# Patient Record
Sex: Female | Born: 1980 | Race: Black or African American | Hispanic: No | Marital: Married | Smoking: Never smoker
Health system: Southern US, Community
[De-identification: ages and names within clinical notes are randomized; demographics above are authoritative.]

## PROBLEM LIST (undated history)

## (undated) DIAGNOSIS — Z8619 Personal history of other infectious and parasitic diseases: Secondary | ICD-10-CM

## (undated) DIAGNOSIS — R519 Headache, unspecified: Secondary | ICD-10-CM

## (undated) HISTORY — DX: Personal history of other infectious and parasitic diseases: Z86.19

## (undated) HISTORY — DX: Headache, unspecified: R51.9

## (undated) NOTE — Assessment & Plan Note (Signed)
Associated Problem(s): Vitamin D deficiency  Formatting of this note might be different from the original.   Due for re-eval after  prescription supplement.. now on OTC.  Electronically signed by Excell Seltzer, MD at 08/10/2022  1:03 PM EDT

## (undated) NOTE — Assessment & Plan Note (Signed)
Associated Problem(s): Chronic tension headaches  Formatting of this note might be different from the original.   Chronic stable control.   Associated with menses.  Electronically signed by Excell Seltzer, MD at 08/10/2022 12:59 PM EDT

## (undated) NOTE — Assessment & Plan Note (Signed)
Associated Problem(s): Dandruff  Formatting of this note might be different from the original.  Stable, chronic.  Continue current medication.     Ketoconazole shampoo.  Electronically signed by Excell Seltzer, MD at 08/10/2022  1:04 PM EDT

## (undated) NOTE — Assessment & Plan Note (Signed)
Associated Problem(s): Chronic constipation  Formatting of this note might be different from the original.  Stable, chronic.  Continue current medication.     Using miralax 2 times daily.  Moderate water and fiber use.  Electronically signed by Excell Seltzer, MD at 08/10/2022  1:01 PM EDT

## (undated) NOTE — Assessment & Plan Note (Signed)
Associated Problem(s): Acne  Formatting of this note might be different from the original.   Chronic, well controlled on clindamycin Benzyl peroxide gel daily.   Interested in retrying  retinol cream.. will let me know which one she used in past.  Electronically signed by Excell Seltzer, MD at 08/10/2022 12:58 PM EDT

## (undated) NOTE — Progress Notes (Signed)
Formatting of this note is different from the original.  Images from the original note were not included.       Patient ID: Sara Dickson, female    DOB: 11-14-1980, 40 y.o.   MRN: [id number]    This visit was conducted in person.    BP 110/64   Pulse 65   Temp (!) 97.4 F (36.3 C) (Temporal)   Ht 5' 5.25" (1.657 m)   Wt 129 lb (58.5 kg)   SpO2 99%   BMI 21.30 kg/m      CC:   Chief Complaint   Patient presents with    Transitions Of Care     From Dr. Selena Batten      Subjective:     HPI:  Sara Dickson is a 75 y.o. female presenting on 08/10/2022 for Transitions Of Care (From Dr. Selena Batten )    Previous PCP: Selena Batten  Last physical: 05/07/21  GYN: 06/18/2022 Dr. Mindi Slicker    The patient presents for  complete physical and review of chronic health problems. He/She also has the following acute concerns today: none       Chronic tension headaches: associated with menses. Using tylnol or ibuprofen.   Not associated with light and sound sensitivity.     Chronic constipation: Using miralax 2 times daily    Relevant past medical, surgical, family and social history reviewed and updated as indicated. Interim medical history since our last visit reviewed.  Allergies and medications reviewed and updated.  Outpatient Medications Prior to Visit   Medication Sig Dispense Refill    acetaminophen (TYLENOL) 325 MG tablet Take 650 mg by mouth every 6 (six) hours as needed.      Cholecalciferol 1.25 MG (50000 UT) TABS Take 1 tablet by mouth once a week. 4 tablet 1    Clindamycin-Benzoyl Per, Refr, gel APPLY TOPICALLY TO AFFECTED AREA EVERY DAY 45 g 0    ibuprofen (ADVIL) 200 MG tablet Take 200 mg by mouth every 6 (six) hours as needed.      ketoconazole (NIZORAL) 2 % shampoo APPLY TO AFFECTED AREA TWICE A WEEK 120 mL 0    levonorgestrel (MIRENA, 52 MG,) 20 MCG/24HR IUD Mirena 20 mcg/24 hours (6 yrs) 52 mg intrauterine device   Take 1 device by intrauterine route.      polyethylene glycol powder (GLYCOLAX/MIRALAX) 17 GM/SCOOP powder Take 1 Container  by mouth as needed.       No facility-administered medications prior to visit.       Per HPI unless specifically indicated in ROS section below  Review of Systems   Constitutional:  Negative for fatigue and fever.   HENT:  Negative for congestion.    Eyes:  Negative for pain.   Respiratory:  Negative for cough and shortness of breath.    Cardiovascular:  Negative for chest pain, palpitations and leg swelling.   Gastrointestinal:  Negative for abdominal pain.   Genitourinary:  Negative for dysuria and vaginal bleeding.   Musculoskeletal:  Negative for back pain.   Neurological:  Negative for syncope, light-headedness and headaches.   Psychiatric/Behavioral:  Negative for dysphoric mood.      Objective:   BP 110/64   Pulse 65   Temp (!) 97.4 F (36.3 C) (Temporal)   Ht 5' 5.25" (1.657 m)   Wt 129 lb (58.5 kg)   SpO2 99%   BMI 21.30 kg/m    Wt Readings from Last 3 Encounters:   08/10/22 129 lb (58.5 kg)  05/07/21 129 lb 1 oz (58.5 kg)   02/20/20 127 lb 12 oz (57.9 kg)       Physical Exam  Constitutional:       General: She is not in acute distress.     Appearance: Normal appearance. She is well-developed. She is not ill-appearing or toxic-appearing.   HENT:      Head: Normocephalic.      Right Ear: Hearing, tympanic membrane, ear canal and external ear normal. Tympanic membrane is not erythematous, retracted or bulging.      Left Ear: Hearing, tympanic membrane, ear canal and external ear normal. Tympanic membrane is not erythematous, retracted or bulging.      Nose: No mucosal edema or rhinorrhea.      Right Sinus: No maxillary sinus tenderness or frontal sinus tenderness.      Left Sinus: No maxillary sinus tenderness or frontal sinus tenderness.      Mouth/Throat:      Pharynx: Uvula midline.   Eyes:      General: Lids are normal. Lids are everted, no foreign bodies appreciated.      Conjunctiva/sclera: Conjunctivae normal.      Pupils: Pupils are equal, round, and reactive to light.   Neck:      Thyroid:  No thyroid mass or thyromegaly.      Vascular: No carotid bruit.      Trachea: Trachea normal.   Cardiovascular:      Rate and Rhythm: Normal rate and regular rhythm.      Pulses: Normal pulses.      Heart sounds: Normal heart sounds, S1 normal and S2 normal. No murmur heard.     No friction rub. No gallop.   Pulmonary:      Effort: Pulmonary effort is normal. No tachypnea or respiratory distress.      Breath sounds: Normal breath sounds. No decreased breath sounds, wheezing, rhonchi or rales.   Abdominal:      General: Bowel sounds are normal.      Palpations: Abdomen is soft.      Tenderness: There is no abdominal tenderness.   Musculoskeletal:      Cervical back: Normal range of motion and neck supple.   Skin:     General: Skin is warm and dry.      Findings: No rash.   Neurological:      Mental Status: She is alert.   Psychiatric:         Mood and Affect: Mood is not anxious or depressed.         Speech: Speech normal.         Behavior: Behavior normal. Behavior is cooperative.         Thought Content: Thought content normal.         Judgment: Judgment normal.       Results for orders placed or performed in visit on 10/30/21   HM PAP SMEAR   Result Value Ref Range    HM Pap smear negative    Results Console HPV   Result Value Ref Range    CHL HPV Negative      Assessment and Plan  The patient's preventative maintenance and recommended screening tests for an annual wellness exam were reviewed in full today.  Brought up to date unless services declined.    Counselled on the importance of diet, exercise, and its role in overall health and mortality.  The patient's FH and SH was reviewed, including their home life, tobacco status,  and drug and alcohol status.   Copy and  Vaccines: TDap 2020, COVID x 3,  Pap/DVE:  2023 neg HPV, repeat in 5 years.  Mammo:  06/2022 at GYN  Bone Density: not indcated  Colon:  not indicated.  Smoking Status: none  ETOH/ drug use: rare/none   Hep C:  done   HIV screen:   done    Routine  general medical examination at a health care facility    Other acne  Assessment & Plan:   Chronic, well controlled on clindamycin Benzyl peroxide gel daily.   Interested in retrying  retinol cream.. will let me know which one she used in past.    Chronic tension-type headache, not intractable  Assessment & Plan:   Chronic stable control.   Associated with menses.    Chronic constipation  Assessment & Plan:  Stable, chronic.  Continue current medication.     Using miralax 2 times daily.  Moderate water and fiber use.    Vitamin D deficiency  Assessment & Plan:   Due for re-eval after  prescription supplement.. now on OTC.    Orders:  -     VITAMIN D 25 Hydroxy (Vit-D Deficiency, Fractures)    Dandruff  Assessment & Plan:  Stable, chronic.  Continue current medication.     Ketoconazole shampoo.    Screening cholesterol level  -     Comprehensive metabolic panel  -     Lipid panel    Other orders  -     Tretinoin; Apply topically at bedtime.  Dispense: 45 g; Refill: 0    No follow-ups on file.     Kerby Nora, MD   Electronically signed by Excell Seltzer, MD at 08/10/2022  2:02 PM EDT

## (undated) NOTE — Telephone Encounter (Signed)
Formatting of this note might be different from the original.  Saa with [facility] lab called with critical results. Pt's CK is 18,086, results given to PCP   Electronically signed by Shon Millet, CMA at 08/11/2022 11:41 AM EDT

## (undated) NOTE — Telephone Encounter (Signed)
Formatting of this note might be different from the original.  Pt notified  Electronically signed by Excell Seltzer, MD at 08/11/2022  1:08 PM EDT

---
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
## 1999-02-11 ENCOUNTER — Inpatient Hospital Stay (HOSPITAL_COMMUNITY): Admission: AD | Admit: 1999-02-11 | Discharge: 1999-02-11 | Payer: Self-pay | Admitting: *Deleted

## 1999-05-19 ENCOUNTER — Inpatient Hospital Stay (HOSPITAL_COMMUNITY): Admission: AD | Admit: 1999-05-19 | Discharge: 1999-05-19 | Payer: Self-pay | Admitting: *Deleted

## 1999-08-13 ENCOUNTER — Inpatient Hospital Stay (HOSPITAL_COMMUNITY): Admission: AD | Admit: 1999-08-13 | Discharge: 1999-08-13 | Payer: Self-pay | Admitting: *Deleted

## 1999-11-10 ENCOUNTER — Inpatient Hospital Stay (HOSPITAL_COMMUNITY): Admission: AD | Admit: 1999-11-10 | Discharge: 1999-11-10 | Payer: Self-pay | Admitting: *Deleted

## 2000-02-10 ENCOUNTER — Inpatient Hospital Stay (HOSPITAL_COMMUNITY): Admission: AD | Admit: 2000-02-10 | Discharge: 2000-02-10 | Payer: Self-pay | Admitting: *Deleted

## 2000-05-03 ENCOUNTER — Inpatient Hospital Stay (HOSPITAL_COMMUNITY): Admission: AD | Admit: 2000-05-03 | Discharge: 2000-05-03 | Payer: Self-pay | Admitting: *Deleted

## 2002-06-13 ENCOUNTER — Other Ambulatory Visit: Admission: RE | Admit: 2002-06-13 | Discharge: 2002-06-13 | Payer: Self-pay | Admitting: Obstetrics and Gynecology

## 2002-06-18 ENCOUNTER — Ambulatory Visit (HOSPITAL_COMMUNITY): Admission: RE | Admit: 2002-06-18 | Discharge: 2002-06-18 | Payer: Self-pay | Admitting: Obstetrics and Gynecology

## 2002-06-18 ENCOUNTER — Encounter: Payer: Self-pay | Admitting: Obstetrics and Gynecology

## 2002-11-06 ENCOUNTER — Inpatient Hospital Stay (HOSPITAL_COMMUNITY): Admission: AD | Admit: 2002-11-06 | Discharge: 2002-11-08 | Payer: Self-pay | Admitting: Obstetrics and Gynecology

## 2007-04-20 ENCOUNTER — Inpatient Hospital Stay (HOSPITAL_COMMUNITY): Admission: AD | Admit: 2007-04-20 | Discharge: 2007-04-20 | Payer: Self-pay | Admitting: Obstetrics and Gynecology

## 2007-06-05 ENCOUNTER — Inpatient Hospital Stay (HOSPITAL_COMMUNITY): Admission: AD | Admit: 2007-06-05 | Discharge: 2007-06-06 | Payer: Self-pay | Admitting: Obstetrics and Gynecology

## 2010-07-24 NOTE — Discharge Summary (Signed)
NAMESURINA, [name]                ACCOUNT NO.:  [id number]   MEDICAL RECORD NO.:  [id number]          PATIENT TYPE:  INP   LOCATION:  9127                          FACILITY:  [facility]   PHYSICIAN:  Zenaida Niece, M.D.DATE OF BIRTH:  1980/06/17   DATE OF ADMISSION:  06/05/2007  DATE OF DISCHARGE:  06/06/2007                               DISCHARGE SUMMARY   ADMISSION DIAGNOSIS:  Intrauterine pregnancy at 39 weeks.   DISCHARGE DIAGNOSIS:  Intrauterine pregnancy at 39 weeks.   PROCEDURES:  On 06/05/2007, she had a spontaneous vaginal delivery.   HISTORY AND PHYSICAL:  This is a 30 year old black female gravida 3,  para 2-0-0-2 with an EGA of [redacted] weeks who presents for elective induction  with a favorable cervix.  Prenatal care was complicated by preterm  contractions with a negative fetal fibronectin and gastroesophageal  reflux controlled with Protonix.   PRENATAL LABORATORIES:  Blood type is A+ with a negative antibody  screen, RPR nonreactive, rubella immune, hepatitis B surface antigen  negative, HIV negative, gonorrhea and Chlamydia negative, cystic  fibrosis negative, 1-hour Glucola 71, and group B strep is negative.   PAST OB HISTORY:  Two vaginal deliveries at term 5 pounds 8 ounces and 7  pounds 12 ounces and her last labor was rapid.   PAST MEDICAL HISTORY:  Migraines.   PHYSICAL EXAMINATION:  GENERAL:  She is afebrile with stable vital  signs.  Fetal heart tracing reactive with contractions every 3-4 minutes  on Pitocin.  ABDOMEN:  Gravid, nontender with an estimated fetal weight of 8 pounds.  CERVIX:  On my first exam, she is 4, 60, -1 to -2 vertex presentation,  adequate pelvis, and membranes were ruptured revealing clear fluid.   HOSPITAL COURSE:  The patient was admitted and put on Pitocin for  induction.  I then examined her and ruptured her membranes.  She  progressed to complete, pushed well, and on June 05, 2007, had a  vaginal delivery of a viable female  infant with Apgars of 8 and 9 and  weighed 7 pounds 11 ounces.  Placenta delivered spontaneous was intact  and was sent for cord blood collection.  Perineum was intact and  estimated blood loss was less than 500 mL.  Postpartum, she had no  significant complications.  She remained afebrile.  Predelivery  hemoglobin is 11, postdelivery is 10.1.  On postpartum day #1, the  patient requested discharge home.  She was felt to be stable enough for  discharge home if the baby could go.  She was discharged home in the  afternoon on June 06, 2007.   DISCHARGE INSTRUCTIONS:  1. Regular diet.  2. Pelvic rest.  3. Follow up in 6 weeks.   MEDICATIONS:  Over-the-counter ibuprofen and Tylenol p.r.n.   She is given our discharge pamphlet.      Zenaida Niece, M.D.  Electronically Signed     TDM/MEDQ  D:  06/29/2007  T:  06/30/2007  Job:  [id number]

## 2010-07-24 NOTE — Discharge Summary (Signed)
Emily [name], Emily [name]                         ACCOUNT NO.:  [id number]   MEDICAL RECORD NO.:  [id number]                   PATIENT TYPE:  INP   LOCATION:  9114                                 FACILITY:  [facility]   PHYSICIAN:  Zenaida Niece, M.D.             DATE OF BIRTH:  [birth year]   DATE OF ADMISSION:  11/06/2002  DATE OF DISCHARGE:  11/08/2002                                 DISCHARGE SUMMARY   ADMISSION DIAGNOSES:  Intrauterine pregnancy at 38 weeks.   DISCHARGE DIAGNOSES:  Intrauterine pregnancy at 38 weeks.   PROCEDURE:  On August 31st she had a spontaneous vaginal delivery.   HISTORY AND PHYSICAL:  This is an 30 year old black female gravida 2, para 1-  0-0-1 with an EGA of [redacted] weeks who presents with a complaint of regular  contractions and decreased fetal movement.  Evaluation in triage revealed  her to be 4 cm, 80%, and minus 2.  Prenatal care complicated only by the  fact that she has a history of a precipitous labor which was only  approximately 2 hours with her last pregnancy.   PRENATAL LABORATORY DATA:  Blood type is A positive with a negative antibody  screen, sickle screen is negative, RPR nonreactive, rubella immune,  hepatitis B surface antigen negative, HIV negative, gonorrhea and Chlamydia  negative.  Her group B strep is negative. One-hour Glucola is 48.   PAST OBSTETRICAL HISTORY:  One vaginal delivery at term, 5 pounds 8 ounces  without complications.   PHYSICAL EXAMINATION:  VITAL SIGNS:  She was afebrile with stable vital  signs.  FETAL MONITORING:  Fetal heart tracing was reactive with contractions every  2 minutes.  ABDOMEN:  Gravid and nontender with an estimated fetal weight of 5-1/2 to 6  pounds.  PELVIC:  Amniotomy revealed clear fluid and she was 4 cm, 75%, and minus 2.   HOSPITAL COURSE:  The patient was admitted and continued to contract in  active labor.  She reached complete, pushed well, and on the evening of  August 31th had a vaginal  delivery of a vigorous female infant with Apgars of  8 and 9 that weighed 7 pounds 12 ounces.  Placenta delivered spontaneous.  Estimated blood loss was 300 mL.  Postpartum she had no complications.  Predelivery hemoglobin 12.3, postdelivery 11.9.  On the morning of  postpartum day #2, she was stable for discharge home.  The patient requested  Depo-Provera for contraception and Dr. Senaida Ores ordered this prior to  discharge.   DISCHARGE INSTRUCTIONS:  Regular diet, pelvic rest, to follow up in six  weeks.  Medications are ibuprofen 600 mg p.o. q.6 h. p.r.n. and she is given  our discharge pamphlet.  Zenaida Niece, M.D.    TDM/MEDQ  D:  11/08/2002  T:  11/09/2002  Job:  [id number]

## 2010-12-01 LAB — CBC
HCT: 29.5 — ABNORMAL LOW
HCT: 32.1 — ABNORMAL LOW
Hemoglobin: 10.1 — ABNORMAL LOW
MCV: 88.2
RBC: 3.34 — ABNORMAL LOW
RBC: 3.64 — ABNORMAL LOW
RDW: 12.9
WBC: 7.4

## 2010-12-01 LAB — RPR: RPR Ser Ql: NONREACTIVE

## 2016-02-04 LAB — RESULTS CONSOLE HPV: CHL HPV: NEGATIVE

## 2016-02-04 LAB — HM PAP SMEAR: HM Pap smear: NEGATIVE

## 2017-04-13 DIAGNOSIS — H5213 Myopia, bilateral: Secondary | ICD-10-CM | POA: Diagnosis not present

## 2017-07-12 DIAGNOSIS — H5213 Myopia, bilateral: Secondary | ICD-10-CM | POA: Diagnosis not present

## 2017-07-12 DIAGNOSIS — H25091 Other age-related incipient cataract, right eye: Secondary | ICD-10-CM | POA: Diagnosis not present

## 2017-07-12 DIAGNOSIS — H18413 Arcus senilis, bilateral: Secondary | ICD-10-CM | POA: Diagnosis not present

## 2018-02-21 DIAGNOSIS — Z130 Encounter for screening for diseases of the blood and blood-forming organs and certain disorders involving the immune mechanism: Secondary | ICD-10-CM | POA: Diagnosis not present

## 2018-02-21 DIAGNOSIS — Z1389 Encounter for screening for other disorder: Secondary | ICD-10-CM | POA: Diagnosis not present

## 2018-02-21 DIAGNOSIS — Z6820 Body mass index (BMI) 20.0-20.9, adult: Secondary | ICD-10-CM | POA: Diagnosis not present

## 2018-02-21 DIAGNOSIS — Z113 Encounter for screening for infections with a predominantly sexual mode of transmission: Secondary | ICD-10-CM | POA: Diagnosis not present

## 2018-02-21 DIAGNOSIS — Z01419 Encounter for gynecological examination (general) (routine) without abnormal findings: Secondary | ICD-10-CM | POA: Diagnosis not present

## 2018-02-21 LAB — HM HIV SCREENING LAB: HM HIV Screening: NEGATIVE

## 2018-02-21 LAB — HM HEPATITIS C SCREENING LAB: HM Hepatitis Screen: NEGATIVE

## 2018-09-06 HISTORY — PX: OTHER SURGICAL HISTORY: SHX169

## 2018-09-12 DIAGNOSIS — H25031 Anterior subcapsular polar age-related cataract, right eye: Secondary | ICD-10-CM | POA: Diagnosis not present

## 2018-09-12 DIAGNOSIS — H2511 Age-related nuclear cataract, right eye: Secondary | ICD-10-CM | POA: Diagnosis not present

## 2018-09-12 DIAGNOSIS — H18413 Arcus senilis, bilateral: Secondary | ICD-10-CM | POA: Diagnosis not present

## 2018-09-12 DIAGNOSIS — H5213 Myopia, bilateral: Secondary | ICD-10-CM | POA: Diagnosis not present

## 2018-09-22 DIAGNOSIS — H2511 Age-related nuclear cataract, right eye: Secondary | ICD-10-CM | POA: Diagnosis not present

## 2018-09-22 DIAGNOSIS — H25031 Anterior subcapsular polar age-related cataract, right eye: Secondary | ICD-10-CM | POA: Diagnosis not present

## 2019-01-03 ENCOUNTER — Encounter: Payer: Self-pay | Admitting: Family Medicine

## 2019-01-03 ENCOUNTER — Ambulatory Visit: Payer: BC Managed Care – PPO | Admitting: Family Medicine

## 2019-01-03 ENCOUNTER — Other Ambulatory Visit: Payer: Self-pay

## 2019-01-03 ENCOUNTER — Ambulatory Visit: Payer: Self-pay | Admitting: Family Medicine

## 2019-01-03 VITALS — BP 100/72 | HR 83 | Temp 98.5°F | Resp 12 | Ht 66.0 in | Wt 122.5 lb

## 2019-01-03 DIAGNOSIS — K5909 Other constipation: Secondary | ICD-10-CM | POA: Insufficient documentation

## 2019-01-03 DIAGNOSIS — Z23 Encounter for immunization: Secondary | ICD-10-CM

## 2019-01-03 DIAGNOSIS — L708 Other acne: Secondary | ICD-10-CM

## 2019-01-03 DIAGNOSIS — G44229 Chronic tension-type headache, not intractable: Secondary | ICD-10-CM | POA: Insufficient documentation

## 2019-01-03 DIAGNOSIS — Z833 Family history of diabetes mellitus: Secondary | ICD-10-CM | POA: Diagnosis not present

## 2019-01-03 DIAGNOSIS — Z83438 Family history of other disorder of lipoprotein metabolism and other lipidemia: Secondary | ICD-10-CM

## 2019-01-03 DIAGNOSIS — L709 Acne, unspecified: Secondary | ICD-10-CM | POA: Insufficient documentation

## 2019-01-03 DIAGNOSIS — G4700 Insomnia, unspecified: Secondary | ICD-10-CM

## 2019-01-03 LAB — LIPID PANEL
Cholesterol: 162 mg/dL (ref 0–200)
HDL: 56.2 mg/dL (ref >39.00)
LDL Cholesterol: 99 mg/dL (ref 0–99)
NonHDL: 105.98
Total CHOL/HDL Ratio: 3
Triglycerides: 34 mg/dL (ref 0.0–149.0)
VLDL: 6.8 mg/dL (ref 0.0–40.0)

## 2019-01-03 LAB — HEMOGLOBIN A1C: Hgb A1c MFr Bld: 5.5 % (ref 4.6–6.5)

## 2019-01-03 NOTE — Assessment & Plan Note (Signed)
Discussed miralax treatment for regular care. If not improvement in 2 months then given duration may consider GI referral sooner. No red flag symptoms

## 2019-01-03 NOTE — Patient Instructions (Signed)
#  Constipation - Drink 64 oz of water a day (any beverage, without caffeine) - water flavor OK - Consider Juice with P (prune, pear, peach) - Take Miralax once a day, increase to 2 times a day if needed - goal: soft, regular bowel movements w/o pain  -- If having regular bowel movements can try decreasing miralax  If having more than 3 bowel movements a day or cramping, stomach discomfort then do not take miralax until symptoms improve  #Insomnia - can try Melatonin 3 mg or 5 mg - take before bed (or 1-2 hours if worried that you may not fall asleep)

## 2019-01-03 NOTE — Assessment & Plan Note (Signed)
Reassuring that she goes back to sleep. Recommend melatonin trial.

## 2019-01-03 NOTE — Progress Notes (Signed)
Subjective:     Emily [name] is a 39 y.o. 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 has not seen a specialist for this before.), and Insomnia (sx present for about 20 years, not sleeping through the night)     HPI  #Constipation - every 2 weeks BM - when it gets bad will take miralax to help - had to use an enema - never on anything regular - water: 1 bottle a day - will drink lemonade or tea - did do a trial of just drinking water - for 1 month w/o improvement (3 or more bottles a day)  - no abdominal pain - no nausea/vomiting - no blood in the stool - no dark or tarry stools  #Insomnia - since first child - will wake up and use the bathroom, sometimes tossing and turning - has not tried anything - will wake up several times and typically falls back asleep - wakes up tired - will take a nap on the weekends - no snoring - no falling asleep during the day - no issues falling asleep   Review of Systems  Constitutional: Negative for chills and fever.  Gastrointestinal: Positive for constipation. Negative for blood in stool.     Social History   Tobacco Use  Smoking Status Never Smoker  Smokeless Tobacco Never Used        Objective:    BP Readings from Last 3 Encounters:  01/03/19 100/72   Wt Readings from Last 3 Encounters:  01/03/19 122 lb 8 oz (55.6 kg)    BP 100/72   Pulse 83   Temp 98.5 F (36.9 C)   Resp 12   Ht 5\' 6"  (1.676 m)   Wt 122 lb 8 oz (55.6 kg)   LMP 12/11/2018 Comment: has a period every month  SpO2 99%   BMI 19.77 kg/m    Physical Exam Constitutional:      General: She is not in acute distress.    Appearance: She is well-developed. She is not diaphoretic.  HENT:     Right Ear: External ear normal.     Left Ear: External ear normal.     Nose: Nose normal.  Eyes:   Conjunctiva/sclera: Conjunctivae normal.  Neck:     Musculoskeletal: Neck supple.  Cardiovascular:     Rate and Rhythm: Normal rate and regular rhythm.     Heart sounds: No murmur.  Pulmonary:     Effort: Pulmonary effort is normal. No respiratory distress.     Breath sounds: Normal breath sounds. No wheezing.  Abdominal:     General: Abdomen is flat. Bowel sounds are normal. There is no distension.     Tenderness: There is no abdominal tenderness.     Comments: Possible stool palpated on the LLQ  Skin:    General: Skin is warm and dry.     Capillary Refill: Capillary refill takes less than 2 seconds.  Neurological:     Mental Status: She is alert. Mental status is at baseline.  Psychiatric:        Mood and Affect: Mood normal.        Behavior: Behavior normal.           Assessment & Plan:   Problem List Items Addressed This Visit      Digestive   Chronic constipation - Primary  Discussed miralax treatment for regular care. If not improvement in 2 months then given duration may consider GI referral sooner. No red flag symptoms      Relevant Medications   polyethylene glycol powder (MIRALAX) 17 GM/SCOOP powder   Other Relevant Orders   TSH     Nervous and Auditory   Chronic tension headaches   Relevant Medications   ibuprofen (ADVIL) 200 MG tablet   acetaminophen (TYLENOL) 325 MG tablet     Musculoskeletal and Integument   Acne   Relevant Medications   Clindamycin-Benzoyl Per, Refr, gel     Other   Insomnia    Reassuring that she goes back to sleep. Recommend melatonin trial.        Other Visit Diagnoses    Family history of diabetes mellitus       Relevant Orders   Hemoglobin A1c   Family history of hyperlipidemia       Relevant Orders   Lipid panel   Need for Tdap vaccination       Relevant Orders   Tdap vaccine greater than or equal to 7yo IM (Completed)       Return in about 2 months (around 03/05/2019).  Lesleigh Noe, MD

## 2019-01-04 LAB — TSH: TSH: 0.88 u[IU]/mL (ref 0.35–4.50)

## 2019-01-05 ENCOUNTER — Encounter: Payer: Self-pay | Admitting: Family Medicine

## 2019-02-09 ENCOUNTER — Encounter: Payer: Self-pay | Admitting: Obstetrics and Gynecology

## 2019-03-07 DIAGNOSIS — Z6821 Body mass index (BMI) 21.0-21.9, adult: Secondary | ICD-10-CM | POA: Diagnosis not present

## 2019-03-07 DIAGNOSIS — E669 Obesity, unspecified: Secondary | ICD-10-CM | POA: Diagnosis not present

## 2019-03-07 DIAGNOSIS — Z01419 Encounter for gynecological examination (general) (routine) without abnormal findings: Secondary | ICD-10-CM | POA: Diagnosis not present

## 2019-03-07 DIAGNOSIS — Z1389 Encounter for screening for other disorder: Secondary | ICD-10-CM | POA: Diagnosis not present

## 2019-03-14 ENCOUNTER — Ambulatory Visit: Payer: BC Managed Care – PPO | Admitting: Family Medicine

## 2019-03-28 DIAGNOSIS — Z03818 Encounter for observation for suspected exposure to other biological agents ruled out: Secondary | ICD-10-CM | POA: Diagnosis not present

## 2019-06-17 DIAGNOSIS — Z20828 Contact with and (suspected) exposure to other viral communicable diseases: Secondary | ICD-10-CM | POA: Diagnosis not present

## 2019-06-17 DIAGNOSIS — Z03818 Encounter for observation for suspected exposure to other biological agents ruled out: Secondary | ICD-10-CM | POA: Diagnosis not present

## 2019-06-17 NOTE — Progress Notes (Signed)
Patient was reached and results were delivered.

## 2019-06-18 DIAGNOSIS — Z20828 Contact with and (suspected) exposure to other viral communicable diseases: Secondary | ICD-10-CM | POA: Diagnosis not present

## 2020-02-20 ENCOUNTER — Encounter: Payer: Self-pay | Admitting: Family Medicine

## 2020-02-20 ENCOUNTER — Other Ambulatory Visit: Payer: Self-pay

## 2020-02-20 ENCOUNTER — Ambulatory Visit (INDEPENDENT_AMBULATORY_CARE_PROVIDER_SITE_OTHER): Payer: BLUE CROSS/BLUE SHIELD | Admitting: Family Medicine

## 2020-02-20 VITALS — BP 90/70 | HR 73 | Temp 98.2°F | Ht 65.5 in | Wt 127.8 lb

## 2020-02-20 DIAGNOSIS — Z83438 Family history of other disorder of lipoprotein metabolism and other lipidemia: Secondary | ICD-10-CM

## 2020-02-20 DIAGNOSIS — K5909 Other constipation: Secondary | ICD-10-CM

## 2020-02-20 DIAGNOSIS — Z0000 Encounter for general adult medical examination without abnormal findings: Secondary | ICD-10-CM | POA: Diagnosis not present

## 2020-02-20 LAB — COMPREHENSIVE METABOLIC PANEL
ALT: 10 U/L (ref 0–35)
AST: 15 U/L (ref 0–37)
Albumin: 4.1 g/dL (ref 3.5–5.2)
Alkaline Phosphatase: 45 U/L (ref 39–117)
BUN: 11 mg/dL (ref 6–23)
CO2: 28 mEq/L (ref 19–32)
Calcium: 8.9 mg/dL (ref 8.4–10.5)
Chloride: 105 mEq/L (ref 96–112)
Creatinine, Ser: 0.88 mg/dL (ref 0.40–1.20)
GFR: 82.4 mL/min (ref >60.00)
Glucose, Bld: 80 mg/dL (ref 70–99)
Potassium: 4.1 mEq/L (ref 3.5–5.1)
Sodium: 139 mEq/L (ref 135–145)
Total Bilirubin: 0.5 mg/dL (ref 0.2–1.2)
Total Protein: 6.6 g/dL (ref 6.0–8.3)

## 2020-02-20 LAB — CBC
HCT: 37.5 % (ref 36.0–46.0)
Hemoglobin: 12.4 g/dL (ref 12.0–15.0)
MCHC: 33.2 g/dL (ref 30.0–36.0)
MCV: 90 fl (ref 78.0–100.0)
Platelets: 220 10*3/uL (ref 150.0–400.0)
RBC: 4.16 Mil/uL (ref 3.87–5.11)
RDW: 13.3 % (ref 11.5–15.5)
WBC: 4 10*3/uL (ref 4.0–10.5)

## 2020-02-20 LAB — LIPID PANEL
Cholesterol: 149 mg/dL (ref 0–200)
HDL: 58.6 mg/dL (ref >39.00)
LDL Cholesterol: 85 mg/dL (ref 0–99)
NonHDL: 90.34
Total CHOL/HDL Ratio: 3
Triglycerides: 27 mg/dL (ref 0.0–149.0)
VLDL: 5.4 mg/dL (ref 0.0–40.0)

## 2020-02-20 LAB — TSH: TSH: 1.18 u[IU]/mL (ref 0.35–4.50)

## 2020-02-20 MED ORDER — CLINDAMYCIN PHOS-BENZOYL PEROX 1.2-5 % EX GEL: 1.0000 "application " | Freq: Every day | CUTANEOUS | 0 refills | Status: DC

## 2020-02-20 NOTE — Patient Instructions (Addendum)
Try to get 600 mg of calcium per day through food if possible  Constipation   Constipation is a common issue. Often it is related to diet and occasionally medications.    What you can do to treat your symptoms 1) Fiber -- Eat more fiber rich foods: beans, broccoli, berries, avocados, popcorn, pear/apple, green peas, turnip greens, brussels sprouts, whole grains (barley, bran, quinoa, oatmeal) -- Take a Fiber supplement: Psyllium (Metamucil)  -- Could also eat Prunes daily  2) Hydration  -- Drink more water: Try to drink 64 oz of water per day  3) Exercise -- Moderate exercise (walking, jogging, biking) for 30 minutes, 5 days a week  4) Dedicate time for Bowel movements - do not delay  5) Stool Softener  - Docusate Sodium (Colace) 100 mg daily or twice daily as needed   If 4-6 weeks have passed and the above has not helped then start the following 6) Laxatives -- Polyethylene Glycol (Miralax) - begin with once daily. After a few days can increase to twice daily Or -- Magnesium Citrate -- Common side effect is nausea and diarrhea -- can try if still not improved  Treating chronic constipation is often about finding the right amount of medication and fiber to keep you regular and comfortable. For some people that may be daily metamucil and colace every other day. For others it may be Metamucil and colace twice daily and Miralax 3 times a week. The goal is to go slow and listen to your body. And normal can be anywhere from 2-3 soft bowel movements a day to 1 bowel movement every 2-3 days.

## 2020-02-20 NOTE — Progress Notes (Signed)
Annual Exam   Chief Complaint:  Chief Complaint  Patient presents with  . Annual Exam    History of Present Illness:  Ms. Emily [name] is a 39 y.o. No obstetric history on file. who LMP was No LMP recorded. (Menstrual status: IUD)., presents today for her annual examination.    Acne - using clindamycin gel w/ success  Nutrition Diet: high fat foods - eating out 2-3 times a week, but cooking most of the time, diverse  Exercise: 4-5 times a week She does not get adequate calcium and Vitamin D in her diet.   Social History   Tobacco Use  Smoking Status Never Smoker  Smokeless Tobacco Never Used   Social History   Substance and Sexual Activity  Alcohol Use Not Currently   Comment: rare- 1 to 2 times a year maybe   Social History   Substance and Sexual Activity  Drug Use Never    Safety The patient wears seatbelts: yes.     The patient feels safe at home and in their relationships: yes.  General Health Dentist in the last year: Yes Eye doctor: no  Menstrual IUD - regular cycles  But did have 2 cycles in month  GYN She is single partner, contraception - IUD.    Cervical Cancer Screening:   Last Pap:  Dec 2020 Results were: no abnormalities /neg HPV DNA    Breast Cancer Screening There is no FH of breast cancer. There is no FH of ovarian cancer. BRCA screening Not Indicated.  Discussed that for average risk women between age 44-49 screening may reduce the risk of breast cancer death, however, at a lower rate than those over age 60. And that the the false-positive rates resulting in unnecessary biopsies with more screening is higher. The balance of benefits vs harms likely improves as you progress through your 40s. The patient will consider want a mammogram this year.   Weight Wt Readings from Last 3 Encounters:  02/20/20 127 lb 12 oz (57.9 kg)  01/03/19 122 lb 8 oz (55.6 kg)   Patient has normal BMI  BMI Readings from Last 1 Encounters:  02/20/20 20.94  kg/m     Chronic disease screening Blood pressure monitoring:  BP Readings from Last 3 Encounters:  02/20/20 90/70  01/03/19 100/72    Lipid Monitoring: Indication for screening: age >104, obesity, diabetes, family hx, CV risk factors.  Lipid screening: Yes  Lab Results  Component Value Date   CHOL 162 01/03/2019   HDL 56.20 01/03/2019   LDLCALC 99 01/03/2019   TRIG 34.0 01/03/2019   CHOLHDL 3 01/03/2019     Diabetes Screening: age >63, overweight, family hx, PCOS, hx of gestational diabetes, at risk ethnicity Diabetes Screening screening: Yes  Lab Results  Component Value Date   HGBA1C 5.5 01/03/2019     Past Medical History:  Diagnosis Date  . Frequent headaches   . History of chicken pox     Past Surgical History:  Procedure Laterality Date  . 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 Authorizing Provider  acetaminophen (TYLENOL) 325 MG tablet Take 650 mg by mouth every 6 (six) hours as needed.    [provider]  Clindamycin-Benzoyl Per, Refr, gel Apply 1 application topically daily. 10/24/18   [provider]  ibuprofen (ADVIL) 200 MG tablet Take 200 mg by mouth every 6 (six) hours as needed.    [provider]  levonorgestrel (MIRENA, 52 MG,) 20 MCG/24HR IUD Mirena 20 mcg/24 hours (6 yrs) 52 mg intrauterine device  Take 1 device by intrauterine route.    [provider]  polyethylene glycol powder (MIRALAX) 17 GM/SCOOP powder Take 1 Container by mouth as needed.    [provider]    No Known Allergies  Gynecologic History: No LMP recorded. (Menstrual status: IUD).  Obstetric History: No obstetric history on file.  Social History   Socioeconomic History  . Marital status: Married    Spouse name: Evette Doffing  . Number of children: 3  . Years of education: College  . Highest education level: Not on file  Occupational History  . Not on  file  Tobacco Use  . Smoking status: Never Smoker  . Smokeless tobacco: Never Used  Vaping Use  . Vaping Use: Never used  Substance and Sexual Activity  . Alcohol use: Not Currently    Comment: rare- 1 to 2 times a year maybe  . Drug use: Never  . Sexual activity: Yes    Birth control/protection: I.U.D.  Other Topics Concern  . 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      Family: see above      Enjoys: eat, shop, sky diving, Aeronautical engineer - adventure things      Exercise: walking 3-5 miles a day a work, playing basketball with kids   Diet: skips breakfast, lunch/dinner - meat and 2 veggies      Safety   Seat belts: Yes    Guns: No   Safe in relationships: Yes    Social Determinants of Radio broadcast assistant Strain: Not on file  Food Insecurity: Not on file  Transportation Needs: Not on file  Physical Activity: Not on file  Stress: Not on file  Social Connections: Not on file  Intimate Partner Violence: Not on file    Family History  Problem Relation Age of Onset  . Alcohol abuse Mother   . Diabetes Father   . Hyperlipidemia Father   . Hypertension Father   . Stroke Father 51  . Depression Daughter   . Arthritis Maternal Grandmother   . Asthma Maternal Grandmother   . COPD Maternal Grandmother   . Hypertension Maternal Grandmother   . Stroke Maternal Grandfather 63  . Cancer Paternal Grandmother        not sure of the type  . Cancer Paternal Grandfather        not sure of the type  . Breast cancer Maternal Aunt        in 86s     Review of Systems  Constitutional: Negative for chills and fever.  HENT: Negative for congestion and sore throat.   Eyes: Negative for blurred vision and double vision.  Respiratory: Negative for shortness of breath.   Cardiovascular: Negative for chest pain.  Gastrointestinal:  Negative for heartburn, nausea and vomiting.  Genitourinary: Negative.   Musculoskeletal: Negative.  Negative for myalgias.  Skin: Negative for rash.  Neurological: Negative for dizziness and headaches.  Endo/Heme/Allergies: Does not bruise/bleed easily.  Psychiatric/Behavioral: Negative for depression. The patient is not nervous/anxious.      Physical Exam BP 90/70   Pulse 73   Temp 98.2 F (36.8 C) (Temporal)   Ht 5' 5.5" (1.664 m)   Wt 127  lb 12 oz (57.9 kg)   SpO2 98%   BMI 20.94 kg/m    BP Readings from Last 3 Encounters:  02/20/20 90/70  01/03/19 100/72      Physical Exam Constitutional:      General: She is not in acute distress.    Appearance: She is well-developed and well-nourished. She is not diaphoretic.  HENT:     Head: Normocephalic and atraumatic.     Right Ear: External ear normal.     Left Ear: External ear normal.     Nose: Nose normal.     Mouth/Throat:     Mouth: Oropharynx is clear and moist.  Eyes:     General: No scleral icterus.    Extraocular Movements: EOM normal.     Conjunctiva/sclera: Conjunctivae normal.  Cardiovascular:     Rate and Rhythm: Normal rate and regular rhythm.     Heart sounds: No murmur heard.   Pulmonary:     Effort: Pulmonary effort is normal. No respiratory distress.     Breath sounds: Normal breath sounds. No wheezing.  Abdominal:     General: Bowel sounds are normal. There is no distension.     Palpations: Abdomen is soft. There is no mass.     Tenderness: There is no abdominal tenderness. There is no guarding or rebound.  Musculoskeletal:        General: No edema. Normal range of motion.     Cervical back: Neck supple.  Lymphadenopathy:     Cervical: No cervical adenopathy.  Skin:    General: Skin is warm and dry.     Capillary Refill: Capillary refill takes less than 2 seconds.  Neurological:     Mental Status: She is alert and oriented to person, place, and time.     Deep Tendon Reflexes: Strength  normal. Reflexes normal.  Psychiatric:        Mood and Affect: Mood and affect normal.        Behavior: Behavior normal.      Results:  PHQ-9:   Depression screen [facility] 2/9 02/20/2020 01/03/2019  Decreased Interest 0 0  Down, Depressed, Hopeless 0 0  PHQ - 2 Score 0 0      Assessment: 39 y.o. No obstetric history on file. female here for routine annual physical examination.  Plan: Problem List Items Addressed This Visit      Digestive   Chronic constipation   Relevant Orders   Comprehensive metabolic panel   CBC   TSH    Other Visit Diagnoses    Annual physical exam    -  Primary   Family history of hyperlipidemia       Relevant Orders   Lipid panel      Screening: -- Blood pressure screen normal -- cholesterol screening: will obtain -- Weight screening: normal -- Diabetes Screening: will obtain -- Nutrition: Encouraged healthy diet  The 10-year ASCVD risk score Mikey Bussing DC Jr., et al., 2013) is: 0.1%   Values used to calculate the score:     Age: 59 years     Sex: Female     Is Non-Hispanic African American: Yes     Diabetic: No     Tobacco smoker: No     Systolic Blood Pressure: 90 mmHg     Is BP treated: No     HDL Cholesterol: 56.2 mg/dL     Total Cholesterol: 162 mg/dL  -- Statin therapy for Age 36-75 with CVD risk >7.5%  Psych -- Depression  screening (PHQ-9): low risk   Safety -- tobacco screening: not using -- alcohol screening:  low-risk usage. -- no evidence of domestic violence or intimate partner violence.   Cancer Screening -- pap smear not collected per ASCCP guidelines -- family history of breast cancer screening: done. not at high risk. -- Mammogram - discussed shared decision for her age, family hx increases risk slightly. She will discuss with OB/GYN as well next month and consider -- Colon cancer (age 43+)-- not indicated  Immunizations Immunization History  Administered Date(s) Administered  . Influenza Inj Mdck Quad Pf  12/20/2018  . Influenza,inj,Quad PF,6+ Mos 01/11/2020  . PFIZER SARS-COV-2 Vaccination 03/07/2019, 03/28/2019, 12/25/2019  . Tdap 01/03/2019    -- flu vaccine up to date -- TDAP q10 years up to date -- Covid-19 Vaccine up to date   Encouraged healthy diet and exercise. Encouraged regular vision and dental care.   Lesleigh Noe, MD

## 2020-03-10 DIAGNOSIS — Z03818 Encounter for observation for suspected exposure to other biological agents ruled out: Secondary | ICD-10-CM | POA: Diagnosis not present

## 2020-03-10 DIAGNOSIS — Z1152 Encounter for screening for COVID-19: Secondary | ICD-10-CM | POA: Diagnosis not present

## 2020-03-14 ENCOUNTER — Encounter: Payer: Self-pay | Admitting: Family Medicine

## 2020-04-29 ENCOUNTER — Other Ambulatory Visit: Payer: Self-pay | Admitting: Family Medicine

## 2020-04-29 NOTE — Telephone Encounter (Signed)
Pharmacy requests refill on: Clindamycin-Benzoyl Perox 1.2-5%   LAST REFILL: 02/20/2020 (Q-45 g, R-0) LAST OV: 02/20/2020 NEXT OV: Not Scheduled  PHARMACY: [id number] [place], [place]

## 2020-05-28 DIAGNOSIS — Z6821 Body mass index (BMI) 21.0-21.9, adult: Secondary | ICD-10-CM | POA: Diagnosis not present

## 2020-05-28 DIAGNOSIS — Z1231 Encounter for screening mammogram for malignant neoplasm of breast: Secondary | ICD-10-CM | POA: Diagnosis not present

## 2020-05-28 DIAGNOSIS — Z1151 Encounter for screening for human papillomavirus (HPV): Secondary | ICD-10-CM | POA: Diagnosis not present

## 2020-05-28 DIAGNOSIS — Z124 Encounter for screening for malignant neoplasm of cervix: Secondary | ICD-10-CM | POA: Diagnosis not present

## 2020-05-28 DIAGNOSIS — Z130 Encounter for screening for diseases of the blood and blood-forming organs and certain disorders involving the immune mechanism: Secondary | ICD-10-CM | POA: Diagnosis not present

## 2020-05-28 DIAGNOSIS — Z01419 Encounter for gynecological examination (general) (routine) without abnormal findings: Secondary | ICD-10-CM | POA: Diagnosis not present

## 2020-06-05 ENCOUNTER — Other Ambulatory Visit: Payer: Self-pay | Admitting: Obstetrics and Gynecology

## 2020-06-05 DIAGNOSIS — R928 Other abnormal and inconclusive findings on diagnostic imaging of breast: Secondary | ICD-10-CM

## 2020-06-26 ENCOUNTER — Ambulatory Visit
Admission: RE | Admit: 2020-06-26 | Discharge: 2020-06-26 | Disposition: A | Discharge status: Home / Self Care | Payer: BLUE CROSS/BLUE SHIELD | Source: Ambulatory Visit | Attending: Obstetrics and Gynecology | Admitting: Obstetrics and Gynecology

## 2020-06-26 ENCOUNTER — Other Ambulatory Visit: Payer: Self-pay

## 2020-06-26 DIAGNOSIS — R922 Inconclusive mammogram: Secondary | ICD-10-CM | POA: Diagnosis not present

## 2020-06-26 DIAGNOSIS — R928 Other abnormal and inconclusive findings on diagnostic imaging of breast: Secondary | ICD-10-CM

## 2020-06-26 DIAGNOSIS — N6311 Unspecified lump in the right breast, upper outer quadrant: Secondary | ICD-10-CM | POA: Diagnosis not present

## 2020-06-26 DIAGNOSIS — N6321 Unspecified lump in the left breast, upper outer quadrant: Secondary | ICD-10-CM | POA: Diagnosis not present

## 2020-07-06 ENCOUNTER — Other Ambulatory Visit: Payer: Self-pay | Admitting: Family Medicine

## 2020-07-07 NOTE — Telephone Encounter (Signed)
Last office visit 02/20/2020 for CPE.  Last refilled 04/29/2020 for 45 g with no refills.  No future appointments.

## 2020-11-17 ENCOUNTER — Other Ambulatory Visit: Payer: Self-pay | Admitting: Family Medicine

## 2020-11-25 NOTE — Telephone Encounter (Signed)
Pt called stating that she only have about 2 days left of her medication,and that she really needs her medication refilled

## 2021-04-13 ENCOUNTER — Encounter: Payer: BLUE CROSS/BLUE SHIELD | Admitting: Family Medicine

## 2021-04-13 ENCOUNTER — Other Ambulatory Visit: Payer: Self-pay | Admitting: Family Medicine

## 2021-04-14 NOTE — Telephone Encounter (Signed)
Last filled on 11/26/20 #45 g with 1 refill. LOV 02/20/20 CPE. Next appointment on 05/07/21

## 2021-04-24 ENCOUNTER — Encounter: Payer: BLUE CROSS/BLUE SHIELD | Admitting: Family Medicine

## 2021-05-07 ENCOUNTER — Ambulatory Visit (INDEPENDENT_AMBULATORY_CARE_PROVIDER_SITE_OTHER): Payer: BC Managed Care – PPO | Admitting: Family Medicine

## 2021-05-07 ENCOUNTER — Other Ambulatory Visit: Payer: Self-pay

## 2021-05-07 VITALS — BP 98/60 | HR 72 | Temp 98.7°F | Ht 65.25 in | Wt 129.1 lb

## 2021-05-07 DIAGNOSIS — Z0001 Encounter for general adult medical examination with abnormal findings: Secondary | ICD-10-CM | POA: Diagnosis not present

## 2021-05-07 DIAGNOSIS — Z0000 Encounter for general adult medical examination without abnormal findings: Secondary | ICD-10-CM

## 2021-05-07 DIAGNOSIS — M2550 Pain in unspecified joint: Secondary | ICD-10-CM | POA: Diagnosis not present

## 2021-05-07 DIAGNOSIS — K5909 Other constipation: Secondary | ICD-10-CM

## 2021-05-07 DIAGNOSIS — G4700 Insomnia, unspecified: Secondary | ICD-10-CM

## 2021-05-07 DIAGNOSIS — R5383 Other fatigue: Secondary | ICD-10-CM | POA: Insufficient documentation

## 2021-05-07 DIAGNOSIS — L210 Seborrhea capitis: Secondary | ICD-10-CM | POA: Insufficient documentation

## 2021-05-07 LAB — HM PAP SMEAR: HM Pap smear: NEGATIVE

## 2021-05-07 LAB — RESULTS CONSOLE HPV: CHL HPV: NEGATIVE

## 2021-05-07 MED ORDER — KETOCONAZOLE 2 % EX SHAM: 1.0000 "application " | MEDICATED_SHAMPOO | CUTANEOUS | 0 refills | Status: DC

## 2021-05-07 NOTE — Progress Notes (Signed)
Annual Exam   Chief Complaint:  Chief Complaint  Patient presents with   Annual Exam   Constipation   Insomnia    Waking up a lot through the night     History of Present Illness:  Ms. Emily [name] is a 41 y.o. No obstetric history on file. who LMP was No LMP recorded. (Menstrual status: IUD)., presents today for her annual examination.    #Insomnia - falling asleep fine - waking up throughout the night, sometimes she will wake and just fall right back to sleep - husband snores - but also waking up when he is not there - does not wake up exhausted - has not tried any OTC options  Constipation - miralax every other day - was trying to drink more water - but then woke up more to urinate  Nutrition Diet: eating twice daily - lunch/dinner w/ veggies Exercise: 4-5 times a week She does get adequate calcium and Vitamin D in her diet.   Social History   Tobacco Use  Smoking Status Never  Smokeless Tobacco Never   Social History   Substance and Sexual Activity  Alcohol Use Not Currently   Comment: rare- 1 to 2 times a year maybe   Social History   Substance and Sexual Activity  Drug Use Never    Safety The patient wears seatbelts: yes.     The patient feels safe at home and in their relationships: yes.  General Health Dentist in the last year: Yes Eye doctor: yes  Menstrual Her menses are monthly, becoming more irregular. Light discharge  GYN She is single partner, contraception - IUD.    Cervical Cancer Screening:   Last AJO:INOMVE the last few years    Breast Cancer Screening There is yes FH of breast cancer. There is no FH of ovarian cancer. BRCA screening Not Indicated.  Discussed that for average risk women between age 88-49 screening may reduce the risk of breast cancer death, however, at a lower rate than those over age 19. And that the the false-positive rates resulting in unnecessary biopsies with more screening is higher. The balance of  benefits vs harms likely improves as you progress through your 40s. The patient does want a mammogram this year.     Weight Wt Readings from Last 3 Encounters:  05/07/21 129 lb 1 oz (58.5 kg)  02/20/20 127 lb 12 oz (57.9 kg)  01/03/19 122 lb 8 oz (55.6 kg)   Patient has normal BMI  BMI Readings from Last 1 Encounters:  05/07/21 21.31 kg/m     Chronic disease screening Blood pressure monitoring:  BP Readings from Last 3 Encounters:  05/07/21 98/60  02/20/20 90/70  01/03/19 100/72    Lipid Monitoring: Indication for screening: age >48, obesity, diabetes, family hx, CV risk factors.  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 Authorizing Provider  acetaminophen (TYLENOL) 325 MG tablet Take 650 mg by mouth every 6 (six) hours as needed.   Yes [provider]  Clindamycin-Benzoyl Per, Refr, gel APPLY TOPICALLY TO AFFECTED AREA EVERY DAY 04/14/21  Yes Lesleigh Noe, MD  ibuprofen (ADVIL) 200 MG tablet Take 200 mg by mouth every 6 (six) hours as needed.   Yes [provider]  levonorgestrel (MIRENA, 52 MG,) 20 MCG/24HR IUD Mirena 20 mcg/24 hours (6 yrs) 52 mg intrauterine device  Take 1 device by intrauterine route.   Yes [provider]  polyethylene glycol powder (GLYCOLAX/MIRALAX) 17 GM/SCOOP powder Take 1 Container by mouth as needed.   Yes [provider]    No Known Allergies  Gynecologic History: No LMP recorded. (Menstrual status: IUD).  Obstetric History: No obstetric history on file.  Social History   Socioeconomic History   Marital status: Married    Spouse name: Evette Doffing   Number of children: 3   Years of education: College   Highest education level: Not on file  Occupational History   Not on file  Tobacco Use   Smoking status: Never   Smokeless tobacco: Never  Vaping Use   Vaping Use: Never used  Substance and Sexual Activity   Alcohol use: Not Currently    Comment: rare- 1 to 2 times a year maybe   Drug use: Never   Sexual activity: Yes    Birth control/protection: I.U.D.  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      Family: see above      Enjoys: eat, shop, sky diving, Aeronautical engineer - adventure things      Exercise: walking 3-5 miles a day a work, playing basketball with kids   Diet: skips breakfast, lunch/dinner - meat and 2 veggies      Safety   Seat belts: Yes    Guns: No   Safe in relationships: Yes    Social Determinants of Radio broadcast assistant Strain: Not on file  Food Insecurity: Not on file  Transportation Needs: Not on file  Physical Activity: Not on file  Stress: Not on file  Social Connections: Not on file  Intimate Partner Violence: Not on file    Family History  Problem Relation Age of Onset   Alcohol abuse Mother    Diabetes Father    Hyperlipidemia Father    Hypertension Father    Stroke Father 1   Depression Daughter    Arthritis Maternal Grandmother    Asthma Maternal Grandmother    COPD Maternal Grandmother    Hypertension Maternal Grandmother    Stroke Maternal Grandfather 82   Cancer Paternal Grandmother        not sure of the type   Cancer Paternal Grandfather        not sure  of the type   Breast cancer Maternal Aunt        in 50s     Review of Systems  Constitutional:  Negative for chills and fever.  HENT:  Negative for congestion and sore throat.   Eyes:  Negative for blurred vision and double vision.  Respiratory:  Negative for shortness of breath.   Cardiovascular:  Negative for chest pain.  Gastrointestinal:  Negative  for heartburn, nausea and vomiting.  Genitourinary: Negative.   Musculoskeletal: Negative.  Negative for myalgias.  Skin:  Negative for rash.  Neurological:  Negative for dizziness and headaches.  Endo/Heme/Allergies:  Does not bruise/bleed easily.  Psychiatric/Behavioral:  Negative for depression. The patient is not nervous/anxious.     Physical Exam BP 98/60    Pulse 72    Temp 98.7 F (37.1 C) (Oral)    Ht 5' 5.25" (1.657 m)    Wt 129 lb 1 oz (58.5 kg)    SpO2 98%    BMI 21.31 kg/m    BP Readings from Last 3 Encounters:  05/07/21 98/60  02/20/20 90/70  01/03/19 100/72      Physical Exam Constitutional:      General: She is not in acute distress.    Appearance: She is well-developed. She is not diaphoretic.  HENT:     Head: Normocephalic and atraumatic.     Right Ear: External ear normal.     Left Ear: External ear normal.     Nose: Nose normal.  Eyes:     General: No scleral icterus.    Extraocular Movements: Extraocular movements intact.     Conjunctiva/sclera: Conjunctivae normal.  Cardiovascular:     Rate and Rhythm: Normal rate and regular rhythm.     Heart sounds: No murmur heard. Pulmonary:     Effort: Pulmonary effort is normal. No respiratory distress.     Breath sounds: Normal breath sounds. No wheezing.  Abdominal:     General: Bowel sounds are normal. There is no distension.     Palpations: Abdomen is soft. There is no mass.     Tenderness: There is no abdominal tenderness. There is no guarding or rebound.  Musculoskeletal:        General: Normal range of motion.     Cervical back: Neck supple.   Lymphadenopathy:     Cervical: No cervical adenopathy.  Skin:    General: Skin is warm and dry.     Capillary Refill: Capillary refill takes less than 2 seconds.  Neurological:     Mental Status: She is alert and oriented to person, place, and time.     Deep Tendon Reflexes: Reflexes normal.  Psychiatric:        Mood and Affect: Mood normal.        Behavior: Behavior normal.     Results:  PHQ-9:  [place] Office Visit from 05/07/2021 in [place] at [place]  PHQ-9 Total Score 4      Depression screen [facility] 2/9 05/07/2021 02/20/2020 01/03/2019  Decreased Interest 0 0 0  Down, Depressed, Hopeless 1 0 0  PHQ - 2 Score 1 0 0  Altered sleeping 3 - -  Tired, decreased energy 0 - -  Change in appetite 0 - -  Feeling bad or failure about yourself  0 - -  Trouble concentrating 0 - -  Moving slowly or fidgety/restless 0 - -  Suicidal thoughts 0 - -  PHQ-9 Score 4 - -  Difficult doing work/chores Not difficult at all - -       Assessment: 41 y.o. No obstetric history on file. female here for routine annual physical examination.  Plan: Problem List Items Addressed This Visit       Digestive   Chronic constipation    Persistent symptoms, will repeat lab test from last year which were normal and also get magnesium.  Discussed that she may just need to take MiraLAX  every day to maintain regular bowel regimen.      Relevant Orders   CBC with Differential   Magnesium   Comprehensive metabolic panel   TSH     Musculoskeletal and Integument   Dandruff    Trial of ketoconazole shampoo, discussed using only once weekly due to hair type.      Relevant Medications   ketoconazole (NIZORAL) 2 % shampoo     Other   Insomnia    Failed melatonin.  Discussed trial of over-the-counter ZzzQuil.  Return if not improving may consider trazodone at that time.      Other fatigue    Patient notes some fatigue this may be related to insomnia but will check vitamin D to  rule out other causes      Relevant Orders   Vitamin D, 25-hydroxy   TSH   Other Visit Diagnoses     Annual physical exam    -  Primary   Relevant Orders   Lipid panel   Arthralgia, unspecified joint           Screening: -- Blood pressure screen normal -- cholesterol screening: will obtain -- Weight screening: normal -- Diabetes Screening: not due for screening -- Nutrition: Encouraged healthy diet  The 10-year ASCVD risk score (Arnett DK, et al., 2019) is: 0.1%   Values used to calculate the score:     Age: 62 years     Sex: Female     Is Non-Hispanic African American: Yes     Diabetic: No     Tobacco smoker: No     Systolic Blood Pressure: 98 mmHg     Is BP treated: No     HDL Cholesterol: 58.6 mg/dL     Total Cholesterol: 149 mg/dL  -- Statin therapy for Age 21-75 with CVD risk >7.5%  Psych -- Depression screening (PHQ-9):  [place] Office Visit from 05/07/2021 in [place] at [place]  PHQ-9 Total Score 4        Safety -- tobacco screening: not using -- alcohol screening:  low-risk usage. -- no evidence of domestic violence or intimate partner violence.   Cancer Screening -- pap smear not collected per ASCCP guidelines -- family history of breast cancer screening: done. not at high risk. -- Mammogram -  up to date -- Colon cancer (age 76+)-- not indicated  Immunizations Immunization History  Administered Date(s) Administered   Influenza Inj Mdck Quad Pf 12/20/2018   Influenza,inj,Quad PF,6+ Mos 01/11/2020   PFIZER(Purple Top)SARS-COV-2 Vaccination 03/07/2019, 03/28/2019, 12/25/2019   Tdap 01/03/2019    -- flu vaccine up to date -- TDAP q10 years up to date - -- Covid-19 Vaccine up to date   Encouraged healthy diet and exercise. Encouraged regular vision and dental care.   Lesleigh Noe, MD

## 2021-05-07 NOTE — Assessment & Plan Note (Signed)
Failed melatonin.  Discussed trial of over-the-counter ZzzQuil.  Return if not improving may consider trazodone at that time. ?

## 2021-05-07 NOTE — Assessment & Plan Note (Signed)
Patient notes some fatigue this may be related to insomnia but will check vitamin D to rule out other causes ?

## 2021-05-07 NOTE — Assessment & Plan Note (Signed)
Persistent symptoms, will repeat lab test from last year which were normal and also get magnesium.  Discussed that she may just need to take MiraLAX every day to maintain regular bowel regimen. ?

## 2021-05-07 NOTE — Patient Instructions (Addendum)
Take miralax every day ? ?Sleep - see hand work ?- ZZZquil  ?- follow-up if not improving ? ?Ketoconazole ? ? ?Sleep hygiene checklist: ?1. Avoid naps during the day ?2. Avoid stimulants such as caffeine and nicotine. Avoid bedtime alcohol (it can speed onset of sleep but the body's metabolism can cause awakenings). At least 2 hours before bedtime ?3. All forms of exercise help ensure sound sleep - limit vigorous exercise to morning or late afternoon ?4. Avoid food too close to bedtime including chocolate (which contains caffeine) ?5. Soak up natural light ?6. Establish regular bedtime routine. ?7. Associate bed with sleep - avoid TV, computer or phone, reading while in bed. ?8. Ensure pleasant, relaxing sleep environment - quiet, dark, cool room. ? ?Good Sleep Hygiene Habits ?-- Got to bed and wake up within an hour of the same time every day ?-- Avoid bright screens (from laptop, phone, TV) within at least 30 minutes before bed. The "blue light" supresses the sleep hormone melatonin and the content may stimulate as well ?-- Maintain a quiet and dark sleep environment (blackout curtains, turn on a fan or white noise to block out disruptive sounds) ?-- Practicing relaxing activites before bed (taking a shower, reading a book, journaling, meditation app) ?-- To quiet a busy mind -- consider journaling before bed (jotting down reminders, worry thoughts, as well as positive things like a gratitude list) ? ? ?Begin a Mindfulness/Meditation practice -- this can take a little as 3 minutes ?-- You can find resources in books ?-- Or you can download apps like  ?---- Headspace App (which currently has free content called "Weathering the Storm") ?---- Calm (which has a few free options)  ?---- Insignt Timer ?---- Stop, Breathe & Think ? ?# With each of these Apps - you should decline the "start free trial" offer and as you search through the App should be able to access some of their free content. You can also chose to pay  for the content if you find one that works well for you.  ? ?# Many of them also offer sleep specific content which may help with insomnia ? ? ?

## 2021-05-07 NOTE — Assessment & Plan Note (Signed)
Trial of ketoconazole shampoo, discussed using only once weekly due to hair type. ?

## 2021-06-04 ENCOUNTER — Other Ambulatory Visit: Payer: Self-pay | Admitting: Family Medicine

## 2021-06-04 ENCOUNTER — Other Ambulatory Visit (INDEPENDENT_AMBULATORY_CARE_PROVIDER_SITE_OTHER): Payer: BC Managed Care – PPO

## 2021-06-04 DIAGNOSIS — K5909 Other constipation: Secondary | ICD-10-CM

## 2021-06-04 DIAGNOSIS — Z0000 Encounter for general adult medical examination without abnormal findings: Secondary | ICD-10-CM

## 2021-06-04 DIAGNOSIS — R5383 Other fatigue: Secondary | ICD-10-CM

## 2021-06-04 DIAGNOSIS — E559 Vitamin D deficiency, unspecified: Secondary | ICD-10-CM

## 2021-06-04 DIAGNOSIS — L210 Seborrhea capitis: Secondary | ICD-10-CM

## 2021-06-04 LAB — TSH: TSH: 3.49 u[IU]/mL (ref 0.35–5.50)

## 2021-06-04 LAB — LIPID PANEL
Cholesterol: 161 mg/dL (ref 0–200)
HDL: 58.9 mg/dL (ref >39.00)
LDL Cholesterol: 92 mg/dL (ref 0–99)
NonHDL: 101.79
Total CHOL/HDL Ratio: 3
Triglycerides: 51 mg/dL (ref 0.0–149.0)
VLDL: 10.2 mg/dL (ref 0.0–40.0)

## 2021-06-04 LAB — CBC WITH DIFFERENTIAL/PLATELET
Basophils Absolute: 0 10*3/uL (ref 0.0–0.1)
Basophils Relative: 0.7 % (ref 0.0–3.0)
Eosinophils Absolute: 0.1 10*3/uL (ref 0.0–0.7)
Eosinophils Relative: 2.8 % (ref 0.0–5.0)
HCT: 40 % (ref 36.0–46.0)
Hemoglobin: 13.3 g/dL (ref 12.0–15.0)
Lymphocytes Relative: 32.6 % (ref 12.0–46.0)
Lymphs Abs: 1.2 10*3/uL (ref 0.7–4.0)
MCHC: 33.1 g/dL (ref 30.0–36.0)
MCV: 91 fl (ref 78.0–100.0)
Monocytes Absolute: 0.4 10*3/uL (ref 0.1–1.0)
Monocytes Relative: 10.4 % (ref 3.0–12.0)
Neutro Abs: 2 10*3/uL (ref 1.4–7.7)
Neutrophils Relative %: 53.5 % (ref 43.0–77.0)
Platelets: 212 10*3/uL (ref 150.0–400.0)
RBC: 4.4 Mil/uL (ref 3.87–5.11)
RDW: 13.2 % (ref 11.5–15.5)
WBC: 3.7 10*3/uL — ABNORMAL LOW (ref 4.0–10.5)

## 2021-06-04 LAB — COMPREHENSIVE METABOLIC PANEL
ALT: 14 U/L (ref 0–35)
AST: 15 U/L (ref 0–37)
Albumin: 4.2 g/dL (ref 3.5–5.2)
Alkaline Phosphatase: 50 U/L (ref 39–117)
BUN: 14 mg/dL (ref 6–23)
CO2: 25 mEq/L (ref 19–32)
Calcium: 9 mg/dL (ref 8.4–10.5)
Chloride: 106 mEq/L (ref 96–112)
Creatinine, Ser: 0.94 mg/dL (ref 0.40–1.20)
GFR: 75.44 mL/min (ref >60.00)
Glucose, Bld: 86 mg/dL (ref 70–99)
Potassium: 4 mEq/L (ref 3.5–5.1)
Sodium: 139 mEq/L (ref 135–145)
Total Bilirubin: 0.3 mg/dL (ref 0.2–1.2)
Total Protein: 6.7 g/dL (ref 6.0–8.3)

## 2021-06-04 LAB — VITAMIN D 25 HYDROXY (VIT D DEFICIENCY, FRACTURES): VITD: <7 ng/mL — ABNORMAL LOW (ref 30.00–100.00)

## 2021-06-04 LAB — MAGNESIUM: Magnesium: 2 mg/dL (ref 1.5–2.5)

## 2021-06-04 MED ORDER — CHOLECALCIFEROL 1.25 MG (50000 UT) PO TABS: 1.0000 | ORAL_TABLET | ORAL | 1 refills | Status: DC

## 2021-06-04 NOTE — Telephone Encounter (Signed)
LOV CPE 05/07/21 ?Last filled on 05/07/21 #120 ml ?

## 2021-06-17 DIAGNOSIS — Z130 Encounter for screening for diseases of the blood and blood-forming organs and certain disorders involving the immune mechanism: Secondary | ICD-10-CM | POA: Diagnosis not present

## 2021-06-17 DIAGNOSIS — Z1389 Encounter for screening for other disorder: Secondary | ICD-10-CM | POA: Diagnosis not present

## 2021-06-17 DIAGNOSIS — Z01419 Encounter for gynecological examination (general) (routine) without abnormal findings: Secondary | ICD-10-CM | POA: Diagnosis not present

## 2021-06-17 DIAGNOSIS — D1730 Benign lipomatous neoplasm of skin and subcutaneous tissue of unspecified sites: Secondary | ICD-10-CM | POA: Diagnosis not present

## 2021-06-17 DIAGNOSIS — Z1231 Encounter for screening mammogram for malignant neoplasm of breast: Secondary | ICD-10-CM | POA: Diagnosis not present

## 2021-06-17 DIAGNOSIS — Z113 Encounter for screening for infections with a predominantly sexual mode of transmission: Secondary | ICD-10-CM | POA: Diagnosis not present

## 2021-07-03 ENCOUNTER — Telehealth: Payer: Self-pay | Admitting: Family Medicine

## 2021-07-03 NOTE — Telephone Encounter (Signed)
Pt called and is asking what she should do about her last apt on 06/04/2021. Does she need to come in and see Dr. Einar Pheasant about her Cholecalciferol 1.25 MG (50000 UT) TABS or come in to get a f/u lab to see how her levels are? Wants to speak with the nurse. ? ?Callback Number: [phone] ?

## 2021-07-06 NOTE — Telephone Encounter (Signed)
Spoke to pt and what she wants is to have her Vitamin d rechecked after taking the high dose replacement, to see how much her level came up.Pt is aware that Dr. Selena Batten is out of the office for a week. Pt has 3 more weeks to take HD pill.   ?

## 2021-07-07 NOTE — Telephone Encounter (Signed)
Please have patient set up a lab appointment for 3 months after she started the high-dose vitamin D.  When she finishes with her last 3 weeks of high-dose vitamin D then she needs to transition to 1000 IUs of vitamin D daily.  She can find that over-the-counter at any drugstore, [facility], [employer], etc. ?

## 2021-07-07 NOTE — Telephone Encounter (Signed)
Pt instructed to schedule lab appt. Please place lab order.  ?

## 2021-07-10 ENCOUNTER — Other Ambulatory Visit: Payer: Self-pay | Admitting: Family Medicine

## 2021-07-10 DIAGNOSIS — L210 Seborrhea capitis: Secondary | ICD-10-CM

## 2021-07-10 NOTE — Telephone Encounter (Signed)
LOV CPE 05/07/21 ?Last filled on 06/05/21 #120 ml ?

## 2021-07-25 ENCOUNTER — Other Ambulatory Visit: Payer: Self-pay | Admitting: Family Medicine

## 2021-07-25 DIAGNOSIS — E559 Vitamin D deficiency, unspecified: Secondary | ICD-10-CM

## 2021-08-13 ENCOUNTER — Other Ambulatory Visit: Payer: Self-pay | Admitting: Family Medicine

## 2021-08-13 DIAGNOSIS — L210 Seborrhea capitis: Secondary | ICD-10-CM

## 2021-08-14 NOTE — Telephone Encounter (Signed)
Mychart sent to pt.

## 2021-08-19 ENCOUNTER — Other Ambulatory Visit: Payer: Self-pay | Admitting: Family Medicine

## 2021-08-19 DIAGNOSIS — E559 Vitamin D deficiency, unspecified: Secondary | ICD-10-CM

## 2021-08-31 ENCOUNTER — Other Ambulatory Visit (INDEPENDENT_AMBULATORY_CARE_PROVIDER_SITE_OTHER): Payer: BC Managed Care – PPO

## 2021-08-31 DIAGNOSIS — E559 Vitamin D deficiency, unspecified: Secondary | ICD-10-CM | POA: Diagnosis not present

## 2021-08-31 LAB — VITAMIN D 25 HYDROXY (VIT D DEFICIENCY, FRACTURES): VITD: 51.22 ng/mL (ref 30.00–100.00)

## 2021-09-03 ENCOUNTER — Other Ambulatory Visit: Payer: BC Managed Care – PPO

## 2021-09-23 ENCOUNTER — Other Ambulatory Visit: Payer: Self-pay | Admitting: Family Medicine

## 2022-03-04 ENCOUNTER — Telehealth: Payer: Self-pay | Admitting: Family Medicine

## 2022-03-04 MED ORDER — CLINDAMYCIN PHOS-BENZOYL PEROX 1.2-5 % EX GEL: CUTANEOUS | 0 refills | Status: DC

## 2022-03-04 NOTE — Telephone Encounter (Signed)
  Encourage patient to contact the pharmacy for refills or they can request refills through [facility]  Did the patient contact the pharmacy: YES   LAST APPOINTMENT DATE: 06/13/21  NEXT APPOINTMENT DATE:TOC 05/27/22  MEDICATION:Clindamycin-Benzoyl Per, Refr, gel   Is the patient out of medication? YES  If not, how much is left?  Is this a 90 day supply:   PHARMACY: [id number] [place], [place] - [phone] [address] Phone: [phone]  Fax: [phone]      Let patient know to contact pharmacy at the end of the day to make sure medication is ready.  Please notify patient to allow 48-72 hours to process

## 2022-05-08 IMAGING — MG DIGITAL DIAGNOSTIC BILAT W/ TOMO W/ CAD
8 series · 8 of 24 positions shown · non-contrast
Comparison: Previous exam(s).
COMPARISON: Previous exam(s).

Addendum:
CLINICAL DATA: Patient returns after baseline screening study for
evaluation of possible masses bilaterally.

EXAM:
DIGITAL DIAGNOSTIC BILATERAL MAMMOGRAM WITH TOMOSYNTHESIS AND CAD;
ULTRASOUND LEFT BREAST LIMITED; ULTRASOUND RIGHT BREAST LIMITED
TECHNIQUE: Bilateral digital diagnostic mammography and breast tomosynthesis
was performed. The images were evaluated with computer-aided
detection.; Targeted ultrasound examination of the left breast was
performed; Targeted ultrasound examination of the right breast was
performed

[R ML synth-2D]
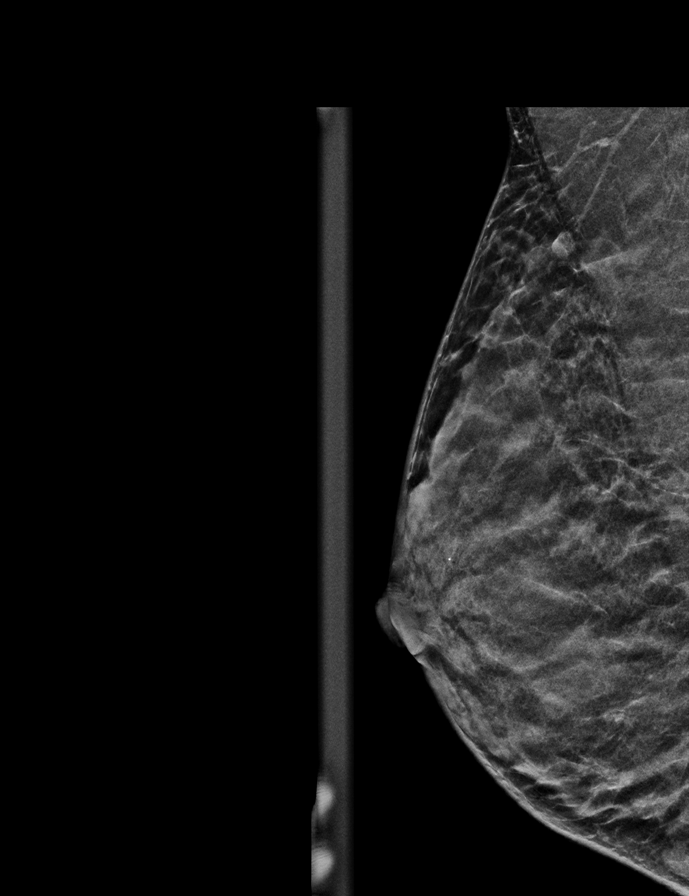

[R MLO synth-2D]
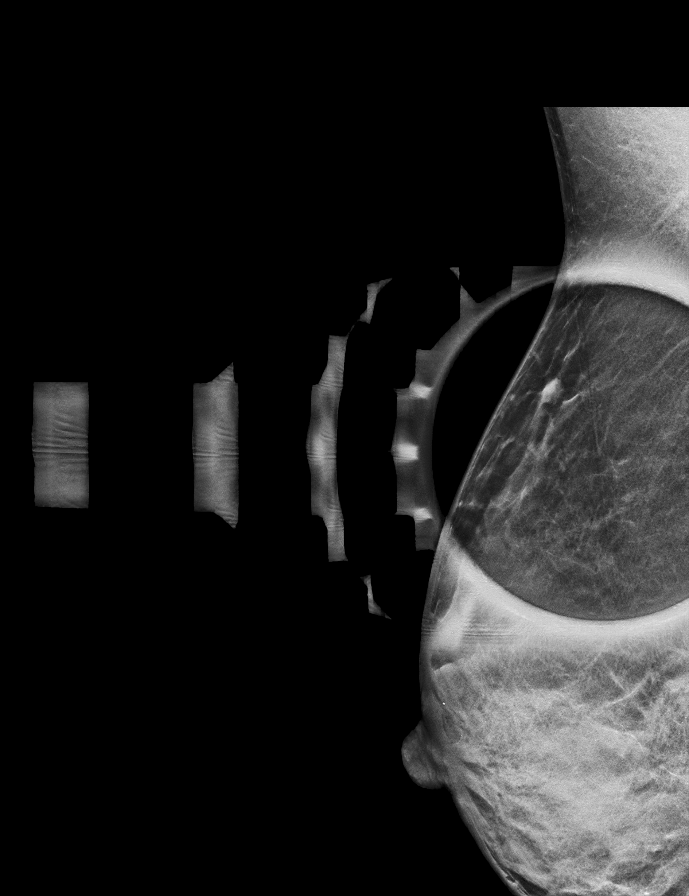

[L ML synth-2D]
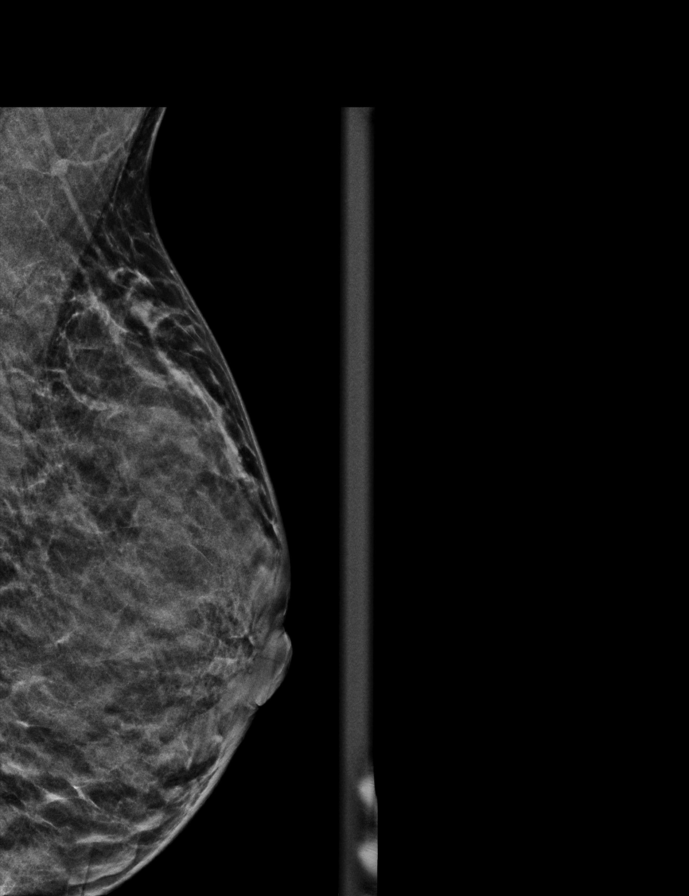

[L MLO synth-2D]
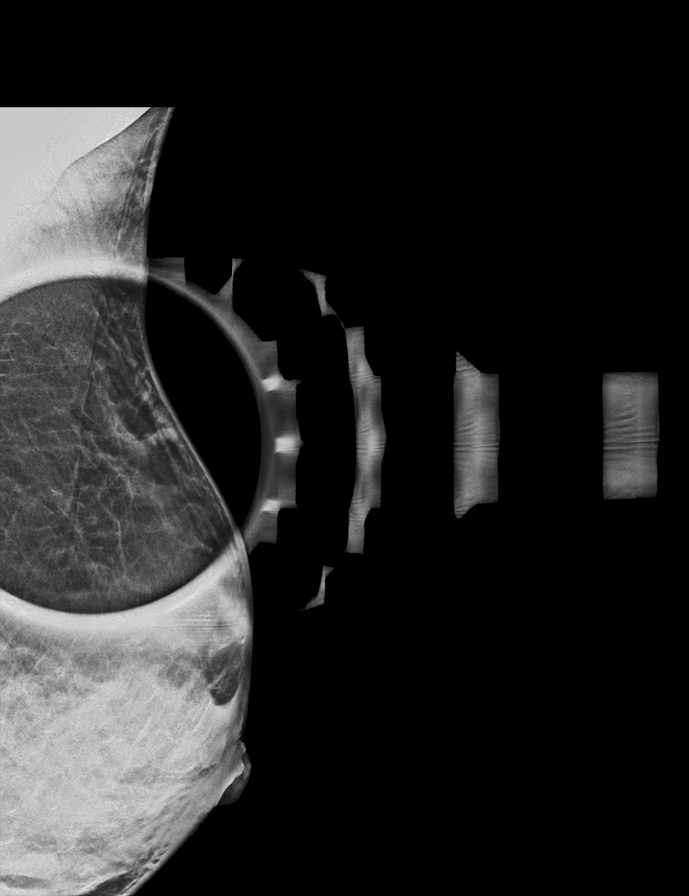

[R MLO tomo · tomo slice 17/32.0]
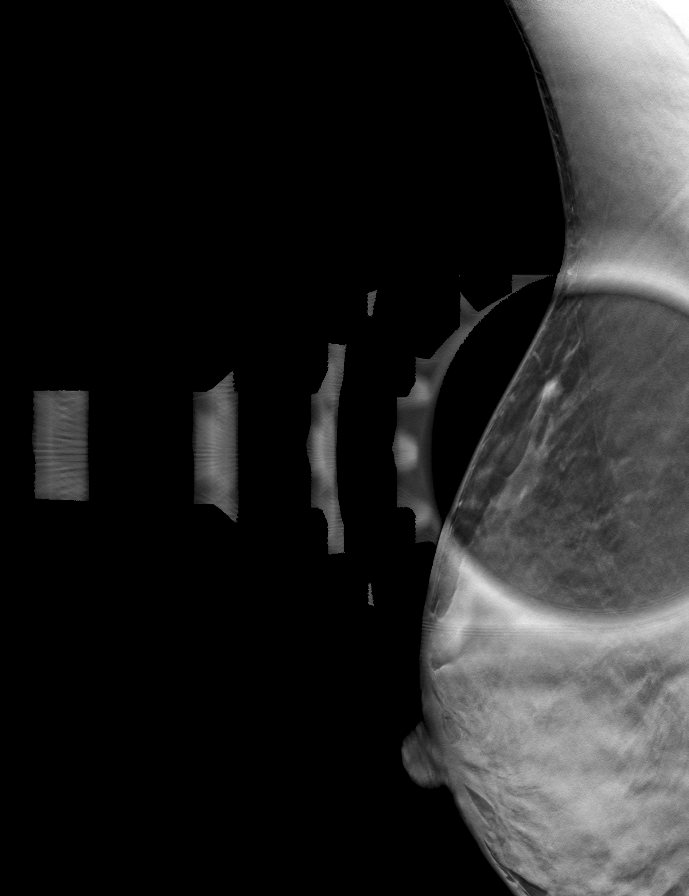

[L ML tomo · tomo slice 19/37.0]
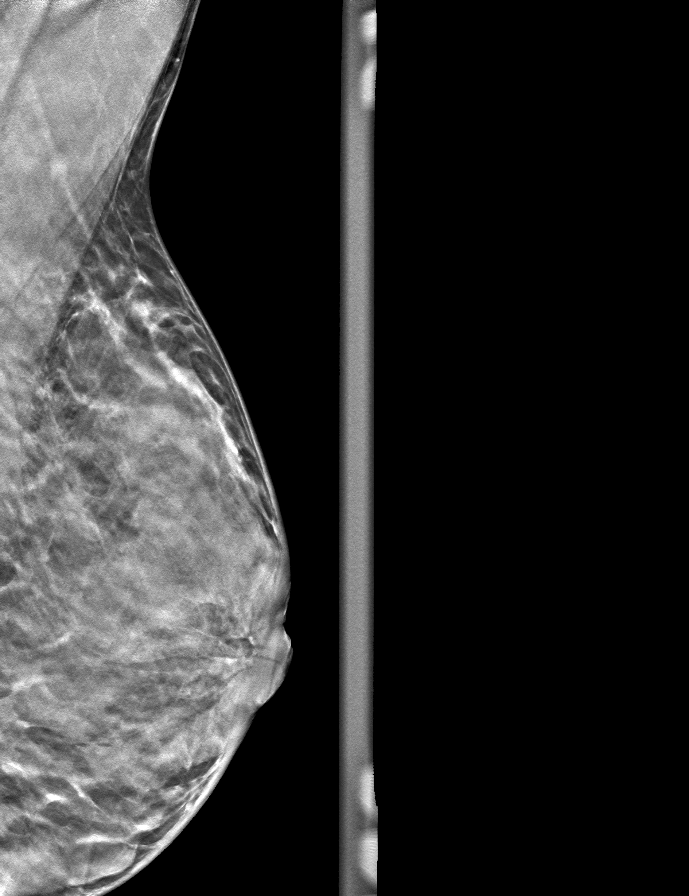

[R ML tomo · tomo slice 18/35.0]
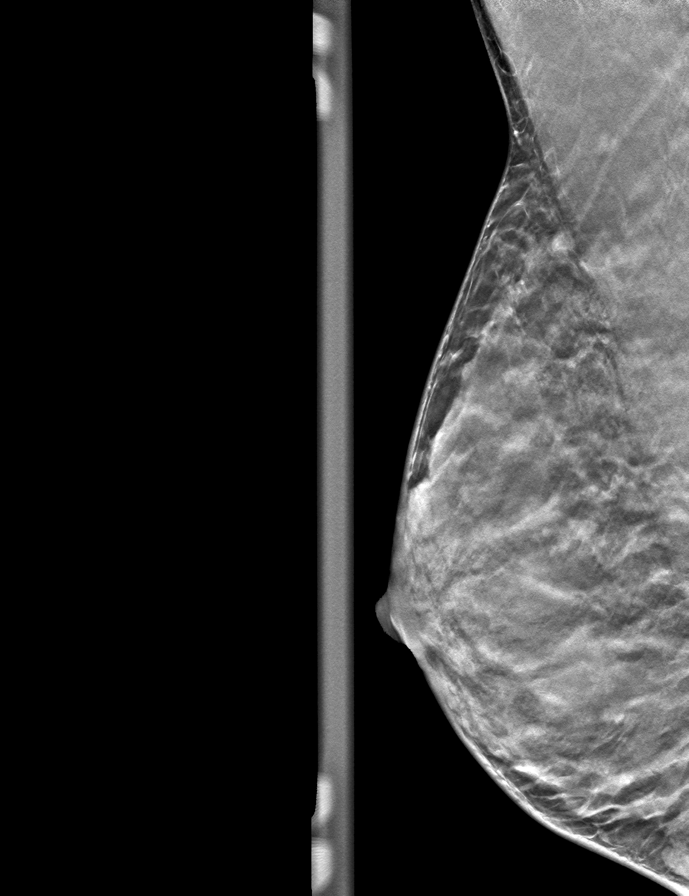

[L MLO tomo · tomo slice 17/32.0]
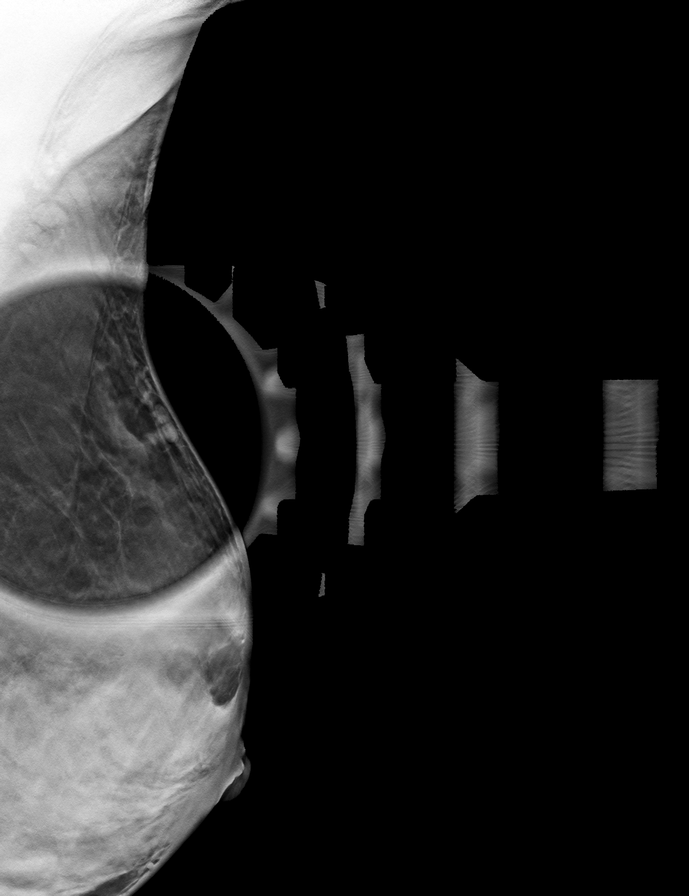

[8 of 24 positions shown; findings below may reference images not displayed]

ACR Breast Density Category d: The breast tissue is extremely dense,
which lowers the sensitivity of mammography.
FINDINGS: RIGHT BREAST:

Mammogram: Additional 2-D and 3-D images are performed. These views
confirm presence of a circumscribed oval mass in the UPPER-OUTER
QUADRANT of the RIGHT breast. Mammographic images were processed
with CAD.

Ultrasound: Targeted ultrasound is performed, showing a
circumscribed oval hypoechoic superficial mass in the 10 o'clock
location of the RIGHT breast 7 centimeters from the nipple. Mass
contains central hyperechoic hilum and blood flow and is consistent
with benign intramammary lymph node.

LEFT BREAST:

Mammogram: Additional 2-D and 3-D images are performed. These views
confirm presence of an oval mass in the superficial aspect of the
UPPER-OUTER QUADRANT of the LEFT breast. Mammographic images were
processed with CAD.

Ultrasound: Targeted ultrasound is performed, showing a
circumscribed oval hypoechoic oval mass in the 2 o'clock location of
the LEFT breast 9 centimeters from nipple measuring 0.7 x 0.2 x
centimeters. Focal internal blood flow and hyperechoic hilum are
consistent benign intramammary lymph node.
IMPRESSION: 1.  No mammographic or ultrasound evidence for malignancy.
2. Bilateral benign intramammary lymph nodes account for the
mammographic findings.

RECOMMENDATION:
Insert screen

I have discussed the findings and recommendations with the patient.
If applicable, a reminder letter will be sent to the patient
regarding the next appointment.

BI-RADS CATEGORY  1: Negative.

ADDENDUM:
Corrected report

Recommendation:  Screening mammogram in one year.(Code:[id number])

*** End of Addendum ***
ACR Breast Density Category d: The breast tissue is extremely dense,
which lowers the sensitivity of mammography.
FINDINGS: RIGHT BREAST:

Mammogram: Additional 2-D and 3-D images are performed. These views
confirm presence of a circumscribed oval mass in the UPPER-OUTER
QUADRANT of the RIGHT breast. Mammographic images were processed
with CAD.

Ultrasound: Targeted ultrasound is performed, showing a
circumscribed oval hypoechoic superficial mass in the 10 o'clock
location of the RIGHT breast 7 centimeters from the nipple. Mass
contains central hyperechoic hilum and blood flow and is consistent
with benign intramammary lymph node.

LEFT BREAST:

Mammogram: Additional 2-D and 3-D images are performed. These views
confirm presence of an oval mass in the superficial aspect of the
UPPER-OUTER QUADRANT of the LEFT breast. Mammographic images were
processed with CAD.

Ultrasound: Targeted ultrasound is performed, showing a
circumscribed oval hypoechoic oval mass in the 2 o'clock location of
the LEFT breast 9 centimeters from nipple measuring 0.7 x 0.2 x
centimeters. Focal internal blood flow and hyperechoic hilum are
consistent benign intramammary lymph node.
IMPRESSION: 1.  No mammographic or ultrasound evidence for malignancy.
2. Bilateral benign intramammary lymph nodes account for the
mammographic findings.

RECOMMENDATION:
Insert screen

I have discussed the findings and recommendations with the patient.
If applicable, a reminder letter will be sent to the patient
regarding the next appointment.

BI-RADS CATEGORY  1: Negative.

## 2022-05-09 ENCOUNTER — Other Ambulatory Visit: Payer: Self-pay | Admitting: Family

## 2022-05-13 NOTE — Telephone Encounter (Signed)
Prescription Request  05/13/2022  LOV: Visit date not found  What is the name of the medication or equipment? Clindamycin-Benzoyl Per, Refr, gel   Have you contacted your pharmacy to request a refill? Yes  Pharmacy stated they've sent over refill requests  Which pharmacy would you like this sent to?  [id number] [place] [facility] [address] [address]Phone: [phone]: [phone]   Patient notified that their request is being sent to the clinical staff for review and that they should receive a response within 2 business days.   Please advise at Mobile [phone](mobile)

## 2022-05-21 NOTE — Telephone Encounter (Signed)
Pt called for status of refill? Pt states she is now completely out of meds. Call back # [phone]

## 2022-05-27 ENCOUNTER — Encounter: Payer: BC Managed Care – PPO | Admitting: Family Medicine

## 2022-06-18 DIAGNOSIS — T8339XA Other mechanical complication of intrauterine contraceptive device, initial encounter: Secondary | ICD-10-CM | POA: Diagnosis not present

## 2022-06-18 DIAGNOSIS — Z30433 Encounter for removal and reinsertion of intrauterine contraceptive device: Secondary | ICD-10-CM | POA: Diagnosis not present

## 2022-06-18 DIAGNOSIS — Z1231 Encounter for screening mammogram for malignant neoplasm of breast: Secondary | ICD-10-CM | POA: Diagnosis not present

## 2022-06-18 DIAGNOSIS — Z30431 Encounter for routine checking of intrauterine contraceptive device: Secondary | ICD-10-CM | POA: Diagnosis not present

## 2022-08-05 ENCOUNTER — Telehealth: Payer: Self-pay | Admitting: Family Medicine

## 2022-08-05 NOTE — Telephone Encounter (Signed)
Patient returned call to the office from [facility] to reschedule [facility] appt for 5/31, got appointment cancelled for 5/31. Told patient someone would be calling her tomorrow to reschedule.

## 2022-08-06 ENCOUNTER — Encounter: Payer: Self-pay | Admitting: Family Medicine

## 2022-08-06 NOTE — Telephone Encounter (Signed)
Patient contacted the office regading this issue again, was wondering if there was a response yet. Informed patient the message was sent to Dr. Ermalene Searing and has not yet received  a response. Patient expressed frustration and said that her appointments in the past have been moved/ cancelled due to construction in the office and Dr. Selena Batten leaving, patient wants to know if there is any way she can be squeezed or worked into Dr. Daphine Deutscher schedule for Tuesday. States she has a very strict work schedule and has planned her appointments carefully to when she knows she will be in town. Says she is set to fly out of town on Wednesday morning so if she was to be worked in it would need to be Tuesday or Monday (aware Dr. Ermalene Searing is off Mondays). Please advise, thank you.

## 2022-08-06 NOTE — Telephone Encounter (Signed)
Scheduled patient, lvm to let patient know I scheduled her.

## 2022-08-06 NOTE — Telephone Encounter (Signed)
Pt called would like to reschedule a TOC appt . It was schedule for since March pt want to know if she can be seen sooner than your next available in AUG .  Please advise [phone]

## 2022-08-10 ENCOUNTER — Other Ambulatory Visit (INDEPENDENT_AMBULATORY_CARE_PROVIDER_SITE_OTHER): Payer: BC Managed Care – PPO

## 2022-08-10 ENCOUNTER — Encounter: Payer: Self-pay | Admitting: Family Medicine

## 2022-08-10 ENCOUNTER — Ambulatory Visit: Payer: BC Managed Care – PPO | Admitting: Family Medicine

## 2022-08-10 VITALS — BP 110/64 | HR 65 | Temp 97.4°F | Ht 65.25 in | Wt 129.0 lb

## 2022-08-10 DIAGNOSIS — G44229 Chronic tension-type headache, not intractable: Secondary | ICD-10-CM | POA: Diagnosis not present

## 2022-08-10 DIAGNOSIS — L708 Other acne: Secondary | ICD-10-CM | POA: Diagnosis not present

## 2022-08-10 DIAGNOSIS — K5909 Other constipation: Secondary | ICD-10-CM | POA: Diagnosis not present

## 2022-08-10 DIAGNOSIS — Z13220 Encounter for screening for lipoid disorders: Secondary | ICD-10-CM

## 2022-08-10 DIAGNOSIS — E559 Vitamin D deficiency, unspecified: Secondary | ICD-10-CM | POA: Diagnosis not present

## 2022-08-10 DIAGNOSIS — L210 Seborrhea capitis: Secondary | ICD-10-CM

## 2022-08-10 DIAGNOSIS — Z0000 Encounter for general adult medical examination without abnormal findings: Secondary | ICD-10-CM

## 2022-08-10 LAB — COMPREHENSIVE METABOLIC PANEL
ALT: 67 U/L — ABNORMAL HIGH (ref 0–35)
AST: 192 U/L — ABNORMAL HIGH (ref 0–37)
Albumin: 4.2 g/dL (ref 3.5–5.2)
Alkaline Phosphatase: 43 U/L (ref 39–117)
BUN: 9 mg/dL (ref 6–23)
CO2: 29 mEq/L (ref 19–32)
Calcium: 9.1 mg/dL (ref 8.4–10.5)
Chloride: 104 mEq/L (ref 96–112)
Creatinine, Ser: 0.7 mg/dL (ref 0.40–1.20)
GFR: 106.57 mL/min (ref >60.00)
Glucose, Bld: 80 mg/dL (ref 70–99)
Potassium: 4.5 mEq/L (ref 3.5–5.1)
Sodium: 138 mEq/L (ref 135–145)
Total Bilirubin: 0.4 mg/dL (ref 0.2–1.2)
Total Protein: 7.2 g/dL (ref 6.0–8.3)

## 2022-08-10 LAB — VITAMIN D 25 HYDROXY (VIT D DEFICIENCY, FRACTURES): VITD: 32.36 ng/mL (ref 30.00–100.00)

## 2022-08-10 LAB — LIPID PANEL
Cholesterol: 154 mg/dL (ref 0–200)
HDL: 58.7 mg/dL (ref >39.00)
LDL Cholesterol: 85 mg/dL (ref 0–99)
NonHDL: 95.65
Total CHOL/HDL Ratio: 3
Triglycerides: 51 mg/dL (ref 0.0–149.0)
VLDL: 10.2 mg/dL (ref 0.0–40.0)

## 2022-08-10 MED ORDER — TRETINOIN 0.025 % EX CREA: TOPICAL_CREAM | Freq: Every day | CUTANEOUS | 0 refills | Status: DC

## 2022-08-10 NOTE — Assessment & Plan Note (Signed)
Stable, chronic.  Continue current medication.   Ketoconazole shampoo.

## 2022-08-10 NOTE — Progress Notes (Signed)
Patient ID: Emily [name], female    DOB: 12-16-1980, 42 y.o.   MRN: [id number]  This visit was conducted in person.  BP 110/64   Pulse 65   Temp (!) 97.4 F (36.3 C) (Temporal)   Ht 5' 5.25" (1.657 m)   Wt 129 lb (58.5 kg)   SpO2 99%   BMI 21.30 kg/m    CC:  Chief Complaint  Patient presents with   Transitions Of Care    From Dr. Selena Batten     Subjective:   HPI: Emily [name] is a 42 y.o. female presenting on 08/10/2022 for Transitions Of Care (From Dr. Selena Batten )  Previous PCP: Selena Batten Last physical: 05/07/21 GYN: 06/18/2022 Dr. Mindi Slicker  The patient presents for  complete physical and review of chronic health problems. He/She also has the following acute concerns today: none      Chronic tension headaches: associated with menses. Using tylnol or ibuprofen.  Not associated with light and sound sensitivity.   Chronic constipation: Using miralax 2 times daily  Relevant past medical, surgical, family and social history reviewed and updated as indicated. Interim medical history since our last visit reviewed. Allergies and medications reviewed and updated. Outpatient Medications Prior to Visit  Medication Sig Dispense Refill   acetaminophen (TYLENOL) 325 MG tablet Take 650 mg by mouth every 6 (six) hours as needed.     Cholecalciferol 1.25 MG (50000 UT) TABS Take 1 tablet by mouth once a week. 4 tablet 1   Clindamycin-Benzoyl Per, Refr, gel APPLY TOPICALLY TO AFFECTED AREA EVERY DAY 45 g 0   ibuprofen (ADVIL) 200 MG tablet Take 200 mg by mouth every 6 (six) hours as needed.     ketoconazole (NIZORAL) 2 % shampoo APPLY TO AFFECTED AREA TWICE A WEEK 120 mL 0   levonorgestrel (MIRENA, 52 MG,) 20 MCG/24HR IUD Mirena 20 mcg/24 hours (6 yrs) 52 mg intrauterine device  Take 1 device by intrauterine route.     polyethylene glycol powder (GLYCOLAX/MIRALAX) 17 GM/SCOOP powder Take 1 Container by mouth as needed.     No facility-administered medications prior to visit.     Per HPI unless  specifically indicated in ROS section below Review of Systems  Constitutional:  Negative for fatigue and fever.  HENT:  Negative for congestion.   Eyes:  Negative for pain.  Respiratory:  Negative for cough and shortness of breath.   Cardiovascular:  Negative for chest pain, palpitations and leg swelling.  Gastrointestinal:  Negative for abdominal pain.  Genitourinary:  Negative for dysuria and vaginal bleeding.  Musculoskeletal:  Negative for back pain.  Neurological:  Negative for syncope, light-headedness and headaches.  Psychiatric/Behavioral:  Negative for dysphoric mood.    Objective:  BP 110/64   Pulse 65   Temp (!) 97.4 F (36.3 C) (Temporal)   Ht 5' 5.25" (1.657 m)   Wt 129 lb (58.5 kg)   SpO2 99%   BMI 21.30 kg/m   Wt Readings from Last 3 Encounters:  08/10/22 129 lb (58.5 kg)  05/07/21 129 lb 1 oz (58.5 kg)  02/20/20 127 lb 12 oz (57.9 kg)      Physical Exam Constitutional:      General: She is not in acute distress.    Appearance: Normal appearance. She is well-developed. She is not ill-appearing or toxic-appearing.  HENT:     Head: Normocephalic.     Right Ear: Hearing, tympanic membrane, ear canal and external ear normal. Tympanic membrane is not erythematous, retracted  or bulging.     Left Ear: Hearing, tympanic membrane, ear canal and external ear normal. Tympanic membrane is not erythematous, retracted or bulging.     Nose: No mucosal edema or rhinorrhea.     Right Sinus: No maxillary sinus tenderness or frontal sinus tenderness.     Left Sinus: No maxillary sinus tenderness or frontal sinus tenderness.     Mouth/Throat:     Pharynx: Uvula midline.  Eyes:     General: Lids are normal. Lids are everted, no foreign bodies appreciated.     Conjunctiva/sclera: Conjunctivae normal.     Pupils: Pupils are equal, round, and reactive to light.  Neck:     Thyroid: No thyroid mass or thyromegaly.     Vascular: No carotid bruit.     Trachea: Trachea normal.   Cardiovascular:     Rate and Rhythm: Normal rate and regular rhythm.     Pulses: Normal pulses.     Heart sounds: Normal heart sounds, S1 normal and S2 normal. No murmur heard.    No friction rub. No gallop.  Pulmonary:     Effort: Pulmonary effort is normal. No tachypnea or respiratory distress.     Breath sounds: Normal breath sounds. No decreased breath sounds, wheezing, rhonchi or rales.  Abdominal:     General: Bowel sounds are normal.     Palpations: Abdomen is soft.     Tenderness: There is no abdominal tenderness.  Musculoskeletal:     Cervical back: Normal range of motion and neck supple.  Skin:    General: Skin is warm and dry.     Findings: No rash.  Neurological:     Mental Status: She is alert.  Psychiatric:        Mood and Affect: Mood is not anxious or depressed.        Speech: Speech normal.        Behavior: Behavior normal. Behavior is cooperative.        Thought Content: Thought content normal.        Judgment: Judgment normal.       Results for orders placed or performed in visit on 10/30/21  HM PAP SMEAR  Result Value Ref Range   HM Pap smear negative   Results Console HPV  Result Value Ref Range   CHL HPV Negative     Assessment and Plan The patient's preventative maintenance and recommended screening tests for an annual wellness exam were reviewed in full today. Brought up to date unless services declined.  Counselled on the importance of diet, exercise, and its role in overall health and mortality. The patient's FH and SH was reviewed, including their home life, tobacco status, and drug and alcohol status.  Copy and Vaccines: TDap 2020, COVID x 3, Pap/DVE:  2023 neg HPV, repeat in 5 years. Mammo:  06/2022 at GYN Bone Density: not indcated Colon:  not indicated. Smoking Status: none ETOH/ drug use: rare/none  Hep C:  done  HIV screen:   done  Routine general medical examination at a health care facility  Other acne Assessment & Plan:   Chronic, well controlled on clindamycin Benzyl peroxide gel daily.  Interested in retrying  retinol cream.. will let me know which one she used in past.   Chronic tension-type headache, not intractable Assessment & Plan:  Chronic stable control.  Associated with menses.   Chronic constipation Assessment & Plan: Stable, chronic.  Continue current medication.   Using miralax 2 times daily.  Moderate  water and fiber use.   Vitamin D deficiency Assessment & Plan:  Due for re-eval after  prescription supplement.. now on OTC.  Orders: -     VITAMIN D 25 Hydroxy (Vit-D Deficiency, Fractures)  Dandruff Assessment & Plan: Stable, chronic.  Continue current medication.   Ketoconazole shampoo.   Screening cholesterol level -     Comprehensive metabolic panel -     Lipid panel  Other orders -     Tretinoin; Apply topically at bedtime.  Dispense: 45 g; Refill: 0    No follow-ups on file.   Kerby Nora, MD

## 2022-08-10 NOTE — Assessment & Plan Note (Addendum)
Stable, chronic.  Continue current medication.   Using miralax 2 times daily.  Moderate water and fiber use.

## 2022-08-10 NOTE — Assessment & Plan Note (Signed)
Due for re-eval after  prescription supplement.. now on OTC.

## 2022-08-10 NOTE — Assessment & Plan Note (Signed)
Chronic stable control.  Associated with menses.

## 2022-08-10 NOTE — Assessment & Plan Note (Signed)
Chronic, well controlled on clindamycin Benzyl peroxide gel daily.  Interested in retrying  retinol cream.. will let me know which one she used in past.

## 2022-08-11 ENCOUNTER — Telehealth: Payer: Self-pay | Admitting: *Deleted

## 2022-08-11 ENCOUNTER — Observation Stay
Admission: EM | Admit: 2022-08-11 | Discharge: 2022-08-12 | Disposition: A | Discharge status: Home / Self Care | Payer: BC Managed Care – PPO | Attending: Hospitalist | Admitting: Hospitalist

## 2022-08-11 DIAGNOSIS — R7989 Other specified abnormal findings of blood chemistry: Secondary | ICD-10-CM | POA: Insufficient documentation

## 2022-08-11 DIAGNOSIS — R7401 Elevation of levels of liver transaminase levels: Secondary | ICD-10-CM | POA: Insufficient documentation

## 2022-08-11 DIAGNOSIS — X509XXA Other and unspecified overexertion or strenuous movements or postures, initial encounter: Secondary | ICD-10-CM | POA: Insufficient documentation

## 2022-08-11 DIAGNOSIS — R8290 Unspecified abnormal findings in urine: Secondary | ICD-10-CM | POA: Insufficient documentation

## 2022-08-11 DIAGNOSIS — R8281 Pyuria: Secondary | ICD-10-CM | POA: Insufficient documentation

## 2022-08-11 DIAGNOSIS — R319 Hematuria, unspecified: Secondary | ICD-10-CM | POA: Insufficient documentation

## 2022-08-11 DIAGNOSIS — K5909 Other constipation: Secondary | ICD-10-CM | POA: Insufficient documentation

## 2022-08-11 DIAGNOSIS — T796XXA Traumatic ischemia of muscle, initial encounter: Principal | ICD-10-CM | POA: Insufficient documentation

## 2022-08-11 DIAGNOSIS — G44229 Chronic tension-type headache, not intractable: Secondary | ICD-10-CM | POA: Insufficient documentation

## 2022-08-11 DIAGNOSIS — R748 Abnormal levels of other serum enzymes: Secondary | ICD-10-CM

## 2022-08-11 LAB — CK: Total CK: 18086 U/L — ABNORMAL HIGH (ref 7–177)

## 2022-08-11 LAB — CBC AND DIFFERENTIAL
Absolute NRBC: 0 10*3/uL (ref 0.00–0.00)
Basophils Absolute Automated: 0.04 10*3/uL (ref 0.00–0.08)
Basophils Automated: 0.7 %
Eosinophils Absolute Automated: 0.06 10*3/uL (ref 0.00–0.44)
Eosinophils Automated: 1.1 %
Hematocrit: 38.6 % (ref 34.7–43.7)
Hgb: 12.4 g/dL (ref 11.4–14.8)
Immature Granulocytes Absolute: 0.01 10*3/uL (ref 0.00–0.07)
Immature Granulocytes: 0.2 %
Instrument Absolute Neutrophil Count: 3.97 10*3/uL (ref 1.10–6.33)
Lymphocytes Absolute Automated: 1.16 10*3/uL (ref 0.42–3.22)
Lymphocytes Automated: 20.8 %
MCH: 29.7 pg (ref 25.1–33.5)
MCHC: 32.1 g/dL (ref 31.5–35.8)
MCV: 92.6 fL (ref 78.0–96.0)
MPV: 9.6 fL (ref 8.9–12.5)
Monocytes Absolute Automated: 0.35 10*3/uL (ref 0.21–0.85)
Monocytes: 6.3 %
Neutrophils Absolute: 3.97 10*3/uL (ref 1.10–6.33)
Neutrophils: 70.9 %
Nucleated RBC: 0 /100 WBC (ref 0.0–0.0)
Platelets: 226 10*3/uL (ref 142–346)
RBC: 4.17 10*6/uL (ref 3.90–5.10)
RDW: 12 % (ref 11–15)
WBC: 5.59 10*3/uL (ref 3.10–9.50)

## 2022-08-11 LAB — URINALYSIS WITH REFLEX TO MICROSCOPIC EXAM - REFLEX TO CULTURE
Bilirubin, UA: NEGATIVE
Glucose, UA: NEGATIVE
Ketones UA: NEGATIVE
Nitrite, UA: NEGATIVE
Protein, UR: NEGATIVE
Specific Gravity UA: 1.019 (ref 1.001–1.035)
Urine pH: 6 (ref 5.0–8.0)
Urobilinogen, UA: NORMAL mg/dL

## 2022-08-11 LAB — COMPREHENSIVE METABOLIC PANEL
ALT: 103 U/L — ABNORMAL HIGH (ref 0–55)
AST (SGOT): 296 U/L — ABNORMAL HIGH (ref 5–41)
Albumin/Globulin Ratio: 1.3 (ref 0.9–2.2)
Albumin: 4 g/dL (ref 3.5–5.0)
Alkaline Phosphatase: 49 U/L (ref 37–117)
Anion Gap: 8 (ref 5.0–15.0)
BUN: 10 mg/dL (ref 7.0–21.0)
Bilirubin, Total: 0.4 mg/dL (ref 0.2–1.2)
CO2: 23 mEq/L (ref 17–29)
Calcium: 9.2 mg/dL (ref 8.5–10.5)
Chloride: 106 mEq/L (ref 99–111)
Creatinine: 0.8 mg/dL (ref 0.4–1.0)
Globulin: 3.2 g/dL (ref 2.0–3.6)
Glucose: 80 mg/dL (ref 70–100)
Potassium: 3.8 mEq/L (ref 3.5–5.3)
Protein, Total: 7.2 g/dL (ref 6.0–8.3)
Sodium: 137 mEq/L (ref 135–145)
eGFR: >60 mL/min/{1.73_m2} (ref >=60)

## 2022-08-11 LAB — BETA HCG QUANTITATIVE, PREGNANCY: hCG, Quant.: <2.4 m[IU]/mL

## 2022-08-11 LAB — CREATINE KINASE (CK): Creatine Kinase (CK): 25364 U/L — ABNORMAL HIGH (ref 29–233)

## 2022-08-11 MED ORDER — ENOXAPARIN SODIUM 40 MG/0.4ML IJ SOSY
40.0000 mg | PREFILLED_SYRINGE | Freq: Every day | INTRAMUSCULAR | Status: DC
Start: 2022-08-12 — End: 2022-08-12

## 2022-08-11 MED ORDER — SALINE SPRAY 0.65 % NA SOLN
2.0000 | NASAL | Status: DC | PRN
Start: 2022-08-11 — End: 2022-08-12

## 2022-08-11 MED ORDER — POTASSIUM CHLORIDE 10 MEQ/100ML IV SOLN
10.0000 meq | INTRAVENOUS | Status: DC | PRN
Start: 2022-08-11 — End: 2022-08-12

## 2022-08-11 MED ORDER — GLUCAGON 1 MG IJ SOLR (WRAP)
1.0000 mg | INTRAMUSCULAR | Status: DC | PRN
Start: 2022-08-11 — End: 2022-08-12

## 2022-08-11 MED ORDER — BENZONATATE 100 MG PO CAPS
100.0000 mg | ORAL_CAPSULE | Freq: Three times a day (TID) | ORAL | Status: DC | PRN
Start: 2022-08-11 — End: 2022-08-12

## 2022-08-11 MED ORDER — PLASMA-LYTE A IV INFUSION
INTRAVENOUS | Status: AC
Start: 2022-08-11 — End: 2022-08-11

## 2022-08-11 MED ORDER — DEXTROSE 50 % IV SOLN
12.5000 g | INTRAVENOUS | Status: DC | PRN
Start: 2022-08-11 — End: 2022-08-12

## 2022-08-11 MED ORDER — POTASSIUM & SODIUM PHOSPHATES 280-160-250 MG PO PACK
2.0000 | PACK | ORAL | Status: DC | PRN
Start: 2022-08-11 — End: 2022-08-12

## 2022-08-11 MED ORDER — NALOXONE HCL 0.4 MG/ML IJ SOLN (WRAP)
0.2000 mg | INTRAMUSCULAR | Status: DC | PRN
Start: 2022-08-11 — End: 2022-08-12

## 2022-08-11 MED ORDER — ACETAMINOPHEN 325 MG PO TABS
650.0000 mg | ORAL_TABLET | Freq: Three times a day (TID) | ORAL | Status: DC | PRN
Start: 2022-08-11 — End: 2022-08-12

## 2022-08-11 MED ORDER — SODIUM CHLORIDE 0.9 % IV BOLUS
30.0000 mL/kg | Freq: Once | INTRAVENOUS | Status: AC
Start: 2022-08-11 — End: 2022-08-11
  Administered 2022-08-11: 1836 mL via INTRAVENOUS

## 2022-08-11 MED ORDER — MELATONIN 3 MG PO TABS
3.0000 mg | ORAL_TABLET | Freq: Every evening | ORAL | Status: DC | PRN
Start: 2022-08-11 — End: 2022-08-12

## 2022-08-11 MED ORDER — POTASSIUM CHLORIDE CRYS ER 20 MEQ PO TBCR
0.0000 meq | EXTENDED_RELEASE_TABLET | ORAL | Status: DC | PRN
Start: 2022-08-11 — End: 2022-08-12

## 2022-08-11 MED ORDER — CARBOXYMETHYLCELLULOSE SOD PF 0.5 % OP SOLN
1.0000 [drp] | Freq: Three times a day (TID) | OPHTHALMIC | Status: DC | PRN
Start: 2022-08-11 — End: 2022-08-12

## 2022-08-11 MED ORDER — BENZOCAINE-MENTHOL MT LOZG (WRAP)
1.0000 | LOZENGE | OROMUCOSAL | Status: DC | PRN
Start: 2022-08-11 — End: 2022-08-12

## 2022-08-11 MED ORDER — GLUCOSE 40 % PO GEL (WRAP)
15.0000 g | ORAL | Status: DC | PRN
Start: 2022-08-11 — End: 2022-08-12

## 2022-08-11 MED ORDER — MAGNESIUM SULFATE IN D5W 1-5 GM/100ML-% IV SOLN
1.0000 g | INTRAVENOUS | Status: DC | PRN
Start: 2022-08-11 — End: 2022-08-12

## 2022-08-11 MED ORDER — DEXTROSE 10 % IV BOLUS
12.5000 g | INTRAVENOUS | Status: DC | PRN
Start: 2022-08-11 — End: 2022-08-12

## 2022-08-11 MED ORDER — PLASMA-LYTE A IV INFUSION
INTRAVENOUS | Status: DC
Start: 2022-08-12 — End: 2022-08-12

## 2022-08-11 MED ORDER — SODIUM CHLORIDE 0.9 % IV BOLUS
1000.0000 mL | Freq: Once | INTRAVENOUS | Status: DC
Start: 2022-08-11 — End: 2022-08-12

## 2022-08-11 NOTE — H&P (Signed)
ADMISSION HISTORY AND PHYSICAL EXAM    Date Time: 08/11/22 3:57 PM  Patient Name: Sara Dickson  Attending Physician: Boykin Peek, MD  Primary Care Physician: Pcp, None, MD    CC: Elevated CK      Assessment / Plan:     42 yo female, with no significant workout, referred to the ED by her PCP after outpatient workup showed abnormal LFTs and elevated CK, likely due to acute rhabdomyolsysi, in the setting of recent rigorous workout.    #Acute Rhabdomyolysis  #Abnormal LFTs, 2/2 Above  #Elevated CK >25K  -Nephrology consulted in ED  -Continue IV Fluids- Plasmalyte 150 cc/hr  -Trend CMP/CK  -Can likely [place] tomorrow CK/LFTs improved    #Asymptomatic Pyuria  -Noted to have Leukocyte Esterase and WBCs in urine  -Urine Cx pending  -Asymptomatic  -Monitor OFF ABx    #Global   Fluids-PO   Electrolytes- K>4, Mg>2   Nutrition- Adult diet Regular   Code Status: Full Code   DVT prophylaxis: lovenox        Disposition: (Please see PAF column for Expected D/C Date)   Today's date: 08/11/2022  Admit Date: 08/11/2022  2:21 PM  Service status: OBS  Clinical Milestones: TBD  Anticipated discharge needs: improved CK    History of Presenting Illness:   Sara Dickson is a 42 y.o. female with no significant PMH who presents to the hospital with abnormal outpatient labs revealing elevated CK, LFTs,    Patient is visiting from [facility] for work. She saw her PCP for routine checkup and labs were ordered which revealed elevated LFTs. Patients PCP contacted her and patient reported restarting a rigorous HIIT workout this week after taking a break from exercising. Her PCP added on a CK, which was elevated and she told her to present to the ED.    Patient denies any significant symptoms. She does note some mild right upper arm weakness and edema. She otherwise denies CP, dyspnea, fevers, chills, N/V, headaches, dizziness, abdominal pain, urinary symptoms, diarrhea, cough. She does not use tobacco, or illicit substances. Uses ETOH  only socially.    Past Medical History:   History reviewed. No pertinent past medical history.       Past Surgical History:   History reviewed. No pertinent surgical history.    Family History:   Stroke- father    Social History:     Social History     Tobacco Use   Smoking Status Never   Smokeless Tobacco Never     Social History     Substance and Sexual Activity   Alcohol Use Not Currently     Social History     Substance and Sexual Activity   Drug Use Never       Allergies:   No Known Allergies    Medications:     Home Medications       Med List Status: In Progress Set By: Renee Rival, RN at 08/11/2022  2:43 PM   No Medications            Review of Systems:   All other systems were reviewed and are negative except: those noted in HPI    Physical Exam:   Patient Vitals for the past 24 hrs:   BP Temp Temp src Pulse Resp SpO2 Height Weight   08/11/22 1356 114/63 98.5 F (36.9 C) Oral 78 16 98 % 1.702 m (5\' 7" ) 61.2 kg (135 lb)   08/11/22 1351 -- -- -- 84 --  99 % -- --     Body mass index is 21.14 kg/m.  No intake or output data in the 24 hours ending 08/11/22 1557    General: awake, alert, oriented x 3; no acute distress.  HEENT: perrla, eomi, sclera anicteric  oropharynx clear without lesions, mucous membranes moist  Neck: supple, no lymphadenopathy, no thyromegaly, no JVD, no carotid bruits  Cardiovascular: regular rate and rhythm, no murmurs, rubs or gallops  Lungs: clear to auscultation bilaterally, without wheezing, rhonchi, or rales  Abdomen: soft, non-tender, non-distended; no palpable masses, no hepatosplenomegaly, normoactive bowel sounds, no rebound or guarding  Extremities: no clubbing, cyanosis, or edema  Neuro: cranial nerves grossly intact, strength 5/5 in upper and lower extremities, sensation intact  Skin: no rashes or lesions noted         Labs:     Results       Procedure Component Value Units Date/Time    Urinalysis Reflex to Microscopic Exam- Reflex to Culture [[id number]]  (Abnormal)  Collected: 08/11/22 1505     Updated: 08/11/22 1543     Urine Type Urine, Clean Ca     Color, UA Yellow     Clarity, UA Clear     Specific Gravity UA 1.019     Urine pH 6.0     Leukocyte Esterase, UA Large     Nitrite, UA Negative     Protein, UR Negative     Glucose, UA Negative     Ketones UA Negative     Urobilinogen, UA Normal mg/dL      Bilirubin, UA Negative     Blood, UA Large     RBC, UA 3-5 /hpf      WBC, UA 11-25 /hpf      Squamous Epithelial Cells, Urine 0-5 /hpf     Creatine Kinase (CK) [[id number]]  (Abnormal) Collected: 08/11/22 1415    Specimen: Blood Updated: 08/11/22 1455     Creatine Kinase (CK) 25,364 U/L     Beta HCG Quantitative, Pregnancy [[id number]] Collected: 08/11/22 1415     Updated: 08/11/22 1454     hCG, Quant. <2.4 mIU/mL     Comprehensive metabolic panel [[id number]]  (Abnormal) Collected: 08/11/22 1415    Specimen: Blood Updated: 08/11/22 1449     Glucose 80 mg/dL      BUN 62.9 mg/dL      Creatinine 0.8 mg/dL      Sodium 528 mEq/L      Potassium 3.8 mEq/L      Chloride 106 mEq/L      CO2 23 mEq/L      Calcium 9.2 mg/dL      Protein, Total 7.2 g/dL      Albumin 4.0 g/dL      AST (SGOT) 413 U/L      ALT 103 U/L      Alkaline Phosphatase 49 U/L      Bilirubin, Total 0.4 mg/dL      Globulin 3.2 g/dL      Albumin/Globulin Ratio 1.3     Anion Gap 8.0     eGFR >60.0 mL/min/1.73 m2     CBC and differential [[id number]] Collected: 08/11/22 1415    Specimen: Blood Updated: 08/11/22 1430     WBC 5.59 x10 3/uL      Hgb 12.4 g/dL      Hematocrit 53.6 %      Platelets 226 x10 3/uL      RBC 4.17 x10 6/uL      MCV 92.6  fL      MCH 29.7 pg      MCHC 32.1 g/dL      RDW 12 %      MPV 9.6 fL      Instrument Absolute Neutrophil Count 3.97 x10 3/uL      Neutrophils 70.9 %      Lymphocytes Automated 20.8 %      Monocytes 6.3 %      Eosinophils Automated 1.1 %      Basophils Automated 0.7 %      Immature Granulocytes 0.2 %      Nucleated RBC 0.0 /100 WBC      Neutrophils Absolute 3.97 x10 3/uL      Lymphocytes  Absolute Automated 1.16 x10 3/uL      Monocytes Absolute Automated 0.35 x10 3/uL      Eosinophils Absolute Automated 0.06 x10 3/uL      Basophils Absolute Automated 0.04 x10 3/uL      Immature Granulocytes Absolute 0.01 x10 3/uL      Absolute NRBC 0.00 x10 3/uL             Imaging personally reviewed, including:   No results found.       Signed by: Boykin Peek, MD  cc:Pcp, None, MD

## 2022-08-11 NOTE — ED to IP RN Note (Signed)
[facility] EMERGENCY DEPT  ED NURSING NOTE FOR THE RECEIVING INPATIENT NURSE   ED NURSE [place]   [place] [address]   ED CHARGE RN Zoe   ADMISSION INFORMATION   Sara Dickson is a 42 y.o. female admitted with an ED diagnosis of:    1. Traumatic rhabdomyolysis, initial encounter         Isolation: None   Allergies: Patient has no known allergies.   Holding Orders confirmed? N/A   Belongings Documented? Yes   Home medications sent to pharmacy confirmed? N/A   NURSING CARE   Patient Comes From:   Mental Status: Home Independent  alert and oriented   ADL: Independent with all ADLs   Ambulation: no difficulty   Pertinent Information  and Safety Concerns:     Broset Violence Risk Level: Low Pt ambulatory to ER w/ steady gait, referred to ER by PCP for rhabdo tx s/p CK level from yesterday resulted at 18K. Pt states she had an intense workout last week on Wed and Fri, over the weekend reporting bilat UE swelling, soreness, and loss of ROM with LE soreness. Pt self treated with heat and ibuprofen with improvement; now only reporting UE soreness when moving. Denies n/v, dark urine, abd pain. No complaints. Speaking in full, clear sentences. No reported sig PMH.      CT / NIH   CT Head ordered on this patient?  No   NIH/Dysphagia assessment done prior to admission? No   VITAL SIGNS (at the time of this note)      Vitals:    08/11/22 1356   BP: 114/63   Pulse: 78   Resp: 16   Temp: 98.5 F (36.9 C)   SpO2: 98%     Pain Score: 0-No pain (08/11/22 1446)

## 2022-08-11 NOTE — ED Triage Notes (Signed)
Pt ambulatory to ER w/ steady gait, referred to ER by PCP for rhabdo tx s/p CK level from yesterday resulted at 18K. Pt states she had an intense workout last week on Wed and Fri, over the weekend reporting bilat UE swelling, soreness, and loss of ROM with LE soreness. Pt self treated with heat and ibuprofen with improvement; now only reporting UE soreness when moving. Denies n/v, dark urine, abd pain. No complaints. Speaking in full, clear sentences. No reported sig PMH.

## 2022-08-11 NOTE — Progress Notes (Signed)
08/11/22 1545   Case Management Quick Doc   Acknowledgment of Outpatient/Observation Observation letter given   CMA Tasks   CMA tasks MOON delivered     Rubbie Battiest., CMS  Case Management Specialist  Emergency Department  [facility] Case Management  [phone]

## 2022-08-11 NOTE — ED Provider Notes (Signed)
[facility] EMERGENCY DEPARTMENT  ATTENDING PHYSICIAN HISTORY AND PHYSICAL EXAM     Patient Name: Sara Dickson, Sara Dickson  Encounter Date:  08/11/2022  Attending Physician: Leane Call MD, MBA  Room:  S C2/S C2  Patient DOB:  [birth year]  Age: 42 y.o. female  MRN:  [id number]  PCP: Marisa Sprinkles, MD         Diagnosis/Disposition:     Final diagnoses:   Traumatic rhabdomyolysis, initial encounter       ED Disposition       ED Disposition   Expedited Observation    Condition   --    Date/Time   Wed Aug 11, 2022  3:06 PM    Comment   Admitting Physician: Boykin Peek [[id number]]   Estimated Length of Stay: < 2 midnights   Tentative Discharge Plan?: Home or Self Care [1]                 Follow-Up Providers (if applicable)    No follow-up provider specified.     New Prescriptions    No medications on file           MDM:      Initial Differential Diagnosis:  Initial differential diagnosis to include but not limited to: AKI, myositis, rhabdo    Plan:  Labs    Final Impression:  Patient given 2 L normal saline CPK is 25,000 increased from 18,003 will admit this         Medical Decision Making                           History of Presenting Illness:     Nursing Triage note: Amb to triage c/o RUE muscle injury r/t intense gym workout. Reports abnormal CK level on lab work from PCP (18000). Told to come into ED by PCP for tx of rhabdo. ROM improving. in NAD. Denies dark urine or abd pain.  Chief complaint: Abnormal Lab    Sara Dickson is a 42 y.o. female presents with rhabdomyolysis.  Patient had blood work drawn yesterday because she had some trouble with the arms and malaise after doing intense workout was noted to have a CPK of 18,000 and sent to the ER.  Is visiting from out of town.  Any fevers or chills.  Denies any being on any new medications.              Review of Systems:  Physical Exam:     Review of Systems    All other systems reviewed and negative except what is specifically documented above.     Pulse 84   BP 114/63  Resp 16  SpO2 99 %  Temp 98.5 F (36.9 C)     Physical Exam   Constitutional: vital signs reviewed, well appearing, no apparent distress  Eyes: no conjunctival injection, lids normal, no drainage  Head: normocephalic, atraumatic  ENT: mucous membranes moist  Cardiovascular: Normal rate and rhythm. No murmur. Capillary refill brisk. Pulses present and strong in all extremities.  Respiratory: Lungs clear to auscultation. No tachypnea. No work of breathing  GI: Soft and nontender in all quadrants. No guarding or rebound. No masses or hepatosplenomegaly. Bowel sounds normal in all quadrants.  GU: -----  Musculoskeletal:     Neck: normal movement, no swelling     Back: -----     Upper extremities: normal movement     Lower extremities: no edema, no calf tenderness  Integ/Skin: Warm and dry. No rashes.  Neuro: Alert, speech normal, no facial assymetery, motor tone symmetric by observation  Psych: normal affect, normal insight        Diagnostic Results:     Laboratory Studies:    All lab values have been personally reviewed by me    Results       Procedure Component Value Units Date/Time    Urinalysis Reflex to Microscopic Exam- Reflex to Culture [[id number]] Collected: 08/11/22 1505     Updated: 08/11/22 1505    Creatine Kinase (CK) [[id number]]  (Abnormal) Collected: 08/11/22 1415    Specimen: Blood Updated: 08/11/22 1455     Creatine Kinase (CK) 25,364 U/L     Beta HCG Quantitative, Pregnancy [[id number]] Collected: 08/11/22 1415     Updated: 08/11/22 1454     hCG, Quant. <2.4 mIU/mL     Comprehensive metabolic panel [[id number]]  (Abnormal) Collected: 08/11/22 1415    Specimen: Blood Updated: 08/11/22 1449     Glucose 80 mg/dL      BUN 44.0 mg/dL      Creatinine 0.8 mg/dL      Sodium 102 mEq/L      Potassium 3.8 mEq/L      Chloride 106 mEq/L      CO2 23 mEq/L      Calcium 9.2 mg/dL      Protein, Total 7.2 g/dL      Albumin 4.0 g/dL      AST (SGOT) 725 U/L      ALT 103 U/L      Alkaline Phosphatase 49 U/L       Bilirubin, Total 0.4 mg/dL      Globulin 3.2 g/dL      Albumin/Globulin Ratio 1.3     Anion Gap 8.0     eGFR >60.0 mL/min/1.73 m2     CBC and differential [[id number]] Collected: 08/11/22 1415    Specimen: Blood Updated: 08/11/22 1430     WBC 5.59 x10 3/uL      Hgb 12.4 g/dL      Hematocrit 42.5 %      Platelets 226 x10 3/uL      RBC 4.17 x10 6/uL      MCV 92.6 fL      MCH 29.7 pg      MCHC 32.1 g/dL      RDW 12 %      MPV 9.6 fL      Instrument Absolute Neutrophil Count 3.97 x10 3/uL      Neutrophils 70.9 %      Lymphocytes Automated 20.8 %      Monocytes 6.3 %      Eosinophils Automated 1.1 %      Basophils Automated 0.7 %      Immature Granulocytes 0.2 %      Nucleated RBC 0.0 /100 WBC      Neutrophils Absolute 3.97 x10 3/uL      Lymphocytes Absolute Automated 1.16 x10 3/uL      Monocytes Absolute Automated 0.35 x10 3/uL      Eosinophils Absolute Automated 0.06 x10 3/uL      Basophils Absolute Automated 0.04 x10 3/uL      Immature Granulocytes Absolute 0.01 x10 3/uL      Absolute NRBC 0.00 x10 3/uL             Radiology Studies:    All images have been personally viewed by me    No orders to display  Interpretations, Clinical Decision Tools and Critical Care:                Procedures:   Procedures  Critical Care Time(not including procedures): 31 minutes.  Due to the high risk of critical illness or multi-organ failure at initial presentation and/or during ED course.   System(s) at risk for compromise:  circulatory, respiratory, renal, hepatic, and metabolic  Critical Diagnosis:   1. Traumatic rhabdomyolysis, initial encounter       The patient was Hypotensive:   No    The patient was Hypoxic:   No    This does not including time spent performing other reported procedures or services.  Critical care time involved full attention to the patient's condition and included:   Review of nursing notes and/or old charts - Yes  Documentation time - Yes  Care, transfer of care, and discharge plans - Yes  Obtaining  necessary history from family, EMS, nursing home staff and/or treating physicians - Yes  Review of medications, allergies, and vital signs - Yes   Consultant collaboration on findings and treatment options - Yes  Ordering, interpreting, and reviewing diagnostic studies/tab tests - Yes           Orders Placed During This Visit:     Encounter Orders:  Orders Placed This Encounter   Procedures    CBC and differential    Comprehensive metabolic panel    Creatine Kinase (CK)    Urinalysis Reflex to Microscopic Exam- Reflex to Culture    Beta HCG Quantitative, Pregnancy    Repeat vital signs when bolus complete    Notify physician when bolus is complete    Admit to Expedited Observation       Encounter Medications:  Medications   sodium chloride 0.9 % bolus 1,836 mL (1,836 mLs Intravenous New Bag 08/11/22 1435)   sodium chloride 0.9 % bolus 1,000 mL (has no administration in time range)           Allergies & Medications:     Allergies:  Shehas No Known Allergies.    Home Medications       Med List Status: In Progress Set By: Renee Rival, RN at 08/11/2022  2:43 PM   No Medications              Past History:     Medical: History reviewed. No pertinent past medical history.    Surgical: She has no past surgical history on file.    Family: History reviewed. No pertinent family history.    Social: She reports that she has never smoked. She has never used smokeless tobacco. She reports that she does not currently use alcohol. She reports that she does not use drugs.        ATTESTATIONS     Leane Call MD, MBA    Scribe Attestation:    There was no scribe involved in the care of this patient.     Documentation Notes:  ILeane Call, am the primary attending physician of record for this patient.   Parts of this note were generated by the Epic EMR system/ Dragon speech recognition and may contain inherent errors or omissions not intended by the user. Grammatical errors, random word insertions, deletions, pronoun  errors and incomplete sentences are occasional consequences of this technology due to software limitations. Not all errors are caught or corrected.  My documentation is often completed after the patient is no longer under my clinical care. In some cases,  the Epic EMR may pull updated results into the above documentation which may not reflect all results or information that were available to me at the time of my medical decision making.   If there are questions or concerns about the content of this note or information contained within the body of this dictation they should be addressed directly with the author for clarification.

## 2022-08-11 NOTE — Plan of Care (Signed)
Problem: Fluid and Electrolyte Imbalance/ Endocrine  Goal: Fluid and electrolyte balance are achieved/maintained  Outcome: Progressing  Flowsheets (Taken 08/11/2022 2038)  Fluid and electrolyte balance are achieved/maintained:   Observe for cardiac arrhythmias   Assess and reassess fluid and electrolyte status   Monitor for muscle weakness   Monitor/assess lab values and report abnormal values

## 2022-08-11 NOTE — Consults (Addendum)
Note to be finalized by the attending later    [facility]  CONSULT  [id number]    Date Time: 08/11/22 5:25 PM  Patient Name: Sara Dickson  Requesting Physician: Boykin Peek, MD  Consulting Physician: Dr. Lilia Argue    Primary Care Physician: Pcp, None, MD    Reason for Consultation: Rhabdomyolysis    Assessment:     Patient Active Problem List   Diagnosis    Traumatic rhabdomyolysis, initial encounter     Elevated CK / rhabdo: appears to be exercise induced  Elevated liver enzymes: due to rhabdo  Pyuria: denies any symptoms  Hematuria: mild hematuria    Recommendations:     Continue Plasmolyte 150cc/hr  Will increase to 200 cc/hr for 6 hours.  Check renal function daily  If pt's CK level is = or < than 25K, patient is stable for d/c b/c the patient appears to be taking PO adequately and pt should f/u w/ her PCP for repeat UA to monitor for hematuria.   No need for abx for pyuria; pt is asymptomatic    Boykin Peek, MD, thank you for this consultation.  We will follow the patient with you during this hospitalization.  Please contact me with any questions or issues.    Signed by:   [facility]  [id number] (office)      -----------------------------------------------------------------------------------------------------------        History of Presenting Illness:   Sara Dickson is a 42 y.o. female with no significant medical history who presented to the hospital with elevated CK. Patient routinely does weight lifting/high intensity work up; however, due to her traveling, she had not been able to work out at a similar intensity and recently resumed her usual weight lifting. Patient went in for her yearly physical check up and was referred to the ED due to her blood work showing elevated CK (18,000). On repeat CK level was 25K    Patient is experiencing some mild muscle tenderness in her RUE but free ROM. After her work up she had sig rom issues but it's better. 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    Past Medical History:   History reviewed. No pertinent past medical history.    Available old records reviewed, including:  prior notes    Past Surgical History:   History reviewed. No pertinent surgical history.    Family History:   History reviewed. No pertinent family history.    Social History:     Social History     Socioeconomic History    Marital status: Married     Spouse name: Not on file    Number of children: Not on file    Years of education: Not on file    Highest education level: Not on file   Occupational History    Not on file   Tobacco Use    Smoking status: Never    Smokeless tobacco: Never   Vaping Use    Vaping status: Never Used   Substance and Sexual Activity    Alcohol use: Not Currently    Drug use: Never    Sexual activity: Not on file   Other Topics Concern    Not on file   Social History Narrative    Not on file     Social Determinants of Health     Financial Resource Strain: Low Risk  (01/03/2019)    Received from [facility]    Overall Financial Resource  Strain (CARDIA)     Difficulty of Paying Living Expenses: Not hard at all   Food Insecurity: No Food Insecurity (08/11/2022)    Hunger Vital Sign     Worried About Running Out of Food in the Last Year: Never true     Ran Out of Food in the Last Year: Never true   Transportation Needs: Not on file   Physical Activity: Not on file   Stress: Not on file   Social Connections: Not on file   Intimate Partner Violence: Not At Risk (08/11/2022)    Humiliation, Afraid, Rape, and Kick questionnaire     Fear of Current or Ex-Partner: No     Emotionally Abused: No     Physically Abused: No     Sexually Abused: No   Housing Stability: Not on file     Allergies:   No Known Allergies    Medications:   (Not in a hospital admission)     Scheduled Meds: PRN Meds:    [START ON 08/12/2022] enoxaparin, 40 mg, Subcutaneous, Daily  sodium chloride, 1,000 mL, Intravenous, Once          Continuous  Infusions:   Plasma-Lyte A 150 mL/hr at 08/11/22 1612    acetaminophen, 650 mg, TID PRN  benzocaine-menthol, 1 lozenge, Q2H PRN  benzonatate, 100 mg, TID PRN  carboxymethylcellulose sodium, 1 drop, TID PRN  dextrose, 15 g of glucose, PRN   Or  dextrose, 12.5 g, PRN   Or  dextrose, 12.5 g, PRN   Or  glucagon (rDNA), 1 mg, PRN  magnesium sulfate, 1 g, PRN  melatonin, 3 mg, QHS PRN  naloxone, 0.2 mg, PRN  potassium & sodium phosphates, 2 packet, PRN  potassium chloride, 0-40 mEq, PRN   And  potassium chloride, 10 mEq, PRN  saline, 2 spray, Q4H PRN          Review of Systems:   A comprehensive review of systems was per the HPI and below:     General ROS: no f/c, no weight changes  HEENT ROS: no blurry vision, no oral lesions, no epistaxis  Allergy/Immunology ROS: no new allergic reactions  Hematological and Lymphatic ROS: no known bleeding/clotting disorders  Respiratory ROS: negative for cough, shortness of breath, or wheezing  Cardiovascular ROS: negative for chest pain or dyspnea on exertion  Gastrointestinal ROS: negative for abd/flank pain, change in bowel habits  Genito-Urinary ROS: negative for dysuria, hematuria, difficulty voiding or nocturia  Musculoskeletal ROS:  negative for trauma or falls, arthralgias  Neurological ROS: no focal weakness, no dizziness  Endocrine ROS: no change in libido, no change in hair distribution  Dermatological ROS: no new rashes or lesions    Physical Exam:     Vitals:    08/11/22 1351 08/11/22 1356 08/11/22 1651   BP:  114/63 105/64   Pulse: 84 78 76   Resp:  16 16   Temp:  98.5 F (36.9 C) 98.4 F (36.9 C)   TempSrc:  Oral Oral   SpO2: 99% 98% 100%   Weight:  61.2 kg (135 lb)    Height:  1.702 m (5\' 7" )        Intake and Output Summary (Last 24 hours) at Date Time  No intake or output data in the 24 hours ending 08/11/22 1725    Recent weights:      08/11/2022   Weight Monitoring   Height 170.2 cm   Height Method Estimated   Weight 61.236 kg  Weight Method Estimated   BMI  (calculated) 21.1 kg/m2        General: awake, alert, oriented x 3, no acute distress  HEENT: sclera anicteric, oropharynx clear without lesions, mucous membranes moist  Neck: supple, no lymphadenopathy, no thyromegaly, no JVD, no carotid bruits  Cardiovascular: regular rate and rhythm, no murmurs, rubs or gallops  Lungs: clear to auscultation bilaterally, without wheezing, rhonchi, or rales  Abdomen: soft, non-tender, non-distended, normoactive bowel sounds, no palpable masses, no hepatosplenomegaly, no rebound or guarding  Extremities: no clubbing, cyanosis, or edema  Neuro: A+O x 3, no gross motor/sensory deficits  Skin: no rashes or lesions noted    Labs:     Recent Labs   Lab 08/11/22  1415   WBC 5.59   Hgb 12.4   Hematocrit 38.6   Platelets 226     Recent Labs   Lab 08/11/22  1415   Sodium 137   Potassium 3.8   Chloride 106   CO2 23   BUN 10.0   Creatinine 0.8   Calcium 9.2   Albumin 4.0   Glucose 80   eGFR >60.0     Recent Labs   Lab 08/11/22  1505   Urine Type Urine, Clean Ca   Color, UA Yellow   Clarity, UA Clear   Specific Gravity UA 1.019   Urine pH 6.0   Nitrite, UA Negative   Ketones UA Negative   Urobilinogen, UA Normal   Bilirubin, UA Negative   Blood, UA Large*   RBC, UA 3-5   WBC, UA 11-25*           Imaging personally reviewed, including:     cc: Boykin Peek, MD  Pcp, None, MD

## 2022-08-11 NOTE — Telephone Encounter (Signed)
[facility] with [facility] called with critical results. Pt's CK is 18,086, results given to PCP

## 2022-08-11 NOTE — Telephone Encounter (Signed)
Pt.notified

## 2022-08-12 DIAGNOSIS — T796XXA Traumatic ischemia of muscle, initial encounter: Secondary | ICD-10-CM

## 2022-08-12 LAB — CREATINE KINASE (CK): Creatine Kinase (CK): 19629 U/L — ABNORMAL HIGH (ref 29–233)

## 2022-08-12 LAB — COMPREHENSIVE METABOLIC PANEL
ALT: 91 U/L — ABNORMAL HIGH (ref 0–55)
AST (SGOT): 250 U/L — ABNORMAL HIGH (ref 5–41)
Albumin/Globulin Ratio: 1.3 (ref 0.9–2.2)
Albumin: 3.3 g/dL — ABNORMAL LOW (ref 3.5–5.0)
Alkaline Phosphatase: 43 U/L (ref 37–117)
Anion Gap: 3 — ABNORMAL LOW (ref 5.0–15.0)
BUN: 11 mg/dL (ref 7.0–21.0)
Bilirubin, Total: 0.3 mg/dL (ref 0.2–1.2)
CO2: 25 mEq/L (ref 17–29)
Calcium: 8.4 mg/dL — ABNORMAL LOW (ref 8.5–10.5)
Chloride: 109 mEq/L (ref 99–111)
Creatinine: 0.8 mg/dL (ref 0.4–1.0)
Globulin: 2.6 g/dL (ref 2.0–3.6)
Glucose: 76 mg/dL (ref 70–100)
Potassium: 4.1 mEq/L (ref 3.5–5.3)
Protein, Total: 5.9 g/dL — ABNORMAL LOW (ref 6.0–8.3)
Sodium: 137 mEq/L (ref 135–145)
eGFR: >60 mL/min/{1.73_m2} (ref >=60)

## 2022-08-12 NOTE — Progress Notes (Signed)
Attending Attestation:     I have seen and personally examined the patient.  I agree with the history, exam, assessment, and management plans as documented by PA [facility].  I concur with or have edited all elements of the provider's note, with any caveats as follows    42 yo F visiting [place] for work from [place] admitted 08/11/22 after referred to ER by PCP with noted abnormal labs (LFTs and CPK).  Labs on admission notable for: AST/ALT 296/103, CPK 25,364, negative serum HCG, abnormal UA (large LE, large blood, 11-25 WBCs). Patient was admitted to observation. She was hydrated with IVF. Nephrology was consulted and recommended IVF. CPK was trended and admission value was the peak. LFTs were trended and trended down. Upon follow up, patient denies ever having any pain (came to ER only b/c encouraged to by PCP). She will be discharged to home. Will advise light activity and PCP f/u for labs and clearance to return activity.    # Traumatic rhabdomyolysis (2/2 vigorous exercise)  # Transaminitis, likely 2/2 rhabdo - improving  # Abnormal UA - not a UTI (no symptoms)    Plan:  - discharge to home today  - outpt f/u with PCP upon return home to [place]  - was to fly home 08/14/22, but is going to get a flight for tomorrow to go home  - light activity, needs PCP f/u for labs and clearance to return to exercise  Full Code    I have personally performed the substantive portion of this visit by conducting a face to face encounter, ordering and reviewing labs/imaging studies, discussing plan of care with consultants and other care team members, and performing medical decision making. A total of 34 minutes was spent.       Disposition:     Today's date: 08/12/2022  Admit Date: 08/11/2022  2:21 PM  Clinical Milestones: Boomer today  Anticipated discharge needs: outpt f/u     Jerral Ralph, MD

## 2022-08-12 NOTE — Discharge Instr - AVS First Page (Addendum)
Reason for your Hospital Admission:  Elevated Creatinine Kinase levels - Rhabdomyolysis     You are were admitted for rhabdomyolysis, which are elevated CK levels secondary to muscle breakdown. We suspect this occurred due to your high intensity workout. You were given IV fluids and your CK levels improved. Complications of rhabdomyolysis include kidney injury/failure. You should continue to hydrate well and follow-up with your primary care provider, avoid high intensity workouts until you are cleared by your primary care provider.     Instructions for after your discharge:  -- Schedule a follow up visit with your primary care provider - within 1 week of discharge from the hospital.  -- Please have labs rechecked in 1 week with your primary care team.   -- Hydrate well.  -- Avoid high intensity physical work outs uncleared cleared to do so by your primary care.  -- Return to the nearest emergency department for chest pain, shortness of breath, fever >100.5 degrees, dizziness, passing out, profuse vomiting or diarrhea,  sudden one sided numbness and/or weakness, severe headache, new confusion, trouble speaking, difficulty walking, facial droop, or any other concerning symptoms

## 2022-08-12 NOTE — Discharge Summary (Signed)
EXPEDITED OBSERVATION UNIT--  PROGRESS NOTE/DISCHARGE SUMMARY    Date Time: 08/12/22 2:54 PM  Patient Name: Sara Dickson  Attending Physician: No att. providers found  Primary Care Physician: Pcp, None, MD    Date of Admission: 08/11/2022  Date of Discharge: 08/12/22    Discharge Dx:   Rhabdomyolysis 2/2 muscle injury  Elevated creatinine kinase levels, improving    Chronic:  Chronic tension headache with menses  Chronic constipation          Disposition:  home     Discharge MEDICATIONS        Medication List      You have not been prescribed any medications.       Consultations:   Treatment Team:   Debbe Mounts, MD  Velna Ochs, RN  Elgie Congo, PA  Ziobro, Maxwell Marion, FNP  Moshe Salisbury, RN   Recent Labs:   Recent Labs   Lab 08/11/22  1415   WBC 5.59   Hgb 12.4   Hematocrit 38.6   Platelets 226     Recent Labs   Lab 08/12/22  0413   Sodium 137   Potassium 4.1   Chloride 109   CO2 25   BUN 11.0   Creatinine 0.8   eGFR >60.0   Glucose 76   Calcium 8.4*               Invalid input(s): "FREET4"   No results found for: "T4". Recent Labs   Lab 08/12/22  0413 08/11/22  1415   Creatine Kinase (CK) 19,629* 25,364*                   Microbiology Results (last 15 days)       Procedure Component Value Units Date/Time    Urine culture [[id number]] Collected: 08/11/22 1505    Order Status: No result Specimen: Urine Updated: 08/11/22 1543          Procedures/Radiology performed:   Radiology: all results from this admission  No results found.  ECHOCARDIOGRAM   Echo Results       None          Hospital Course:   Reason for admission/ HPI: Rhabdomyolysis 2/2 muscle injury, elevated CK levels    Hospital Course: This is a 42 y.o. female admitted 08/11/2022  for Rhabdomyolysis 2/2 muscle injury and elevated CK levels.  Patient reports that she had not been working out as regularly as she usually does, participated in high intensity (HIT) workout, noted to have right upper extremity swelling afterwards.   Patient was seen by her PCP for her annual physical, blood work was done which showed elevated CK levels, therefore she was instructed to go to the ED by her PCP. Upon ED arrival, VSS.  Labs notable for: AST/ALT 296/103, CK 25,364. UA with large LE, large blood, WBC 11-25. Urine culture pending. Received weight-dosed NS fluid bolus in ED. patient was admitted to observation for rhabdomyolysis secondary to muscle injury. Started on IV Plasma-Lyte at 150 mL/hr. she was seen by nephrology who cleared for discharge if patient's CK level improved to = or < 25K. Patient's AST/ALT improved from 296/103 -> 250/91 and CK 25,364 -> 19,629.  Ducted patient to hydrate well on discharge.  Hold off on further high intensity workouts until cleared by PCP. Should f/u with PCP in 3-5 days for repeat labs.    Currently the patient is hemodynamically stable for discharge with outpatient follow up as outlined below.  Discharge Day Exam:  DISCHARGE DAY EXAM:  BP 99/52   Pulse 71   Temp 98.6 F (37 C) (Oral)   Resp 16   Ht 1.702 m (5\' 7" )   Wt 61.2 kg (135 lb)   LMP 07/21/2022 (Approximate)   SpO2 99%   BMI 21.14 kg/m   Body mass index is 21.14 kg/m.    Physical Exam  Constitutional:       Appearance: Normal appearance.   Cardiovascular:      Rate and Rhythm: Normal rate and regular rhythm.   Pulmonary:      Effort: Pulmonary effort is normal.      Breath sounds: Normal breath sounds.   Abdominal:      General: Abdomen is flat. Bowel sounds are normal.      Palpations: Abdomen is soft.   Neurological:      Mental Status: She is alert and oriented to person, place, and time.        Discharge Condition & Coordination:     Condition: Stable    Coordination  Updated patient with discharge plan, all questions answered    Emergency Contact  Extended Emergency Contact Information  Primary Emergency Contact: Mckelvie,VINCENT  Mobile Phone: [phone]  Relation: Spouse  Preferred language: English  Interpreter needed? No    Pending  Results, Recommendations & Instructions to providers after discharge:   Micro / Labs / Path pending:   Unresulted Labs       None          Date of completion for antibiotics or other medications: N/A     Discharge Instructions:   Diet:: Regular Diet   Activity: as tolerated    Instructions for after your discharge:  -- Schedule a follow up visit with your primary care provider - within 1 week of discharge from the hospital.  -- Please have labs rechecked in 1 week with your primary care team.   -- Hydrate well.  -- Avoid high intensity physical work outs uncleared cleared to do so by your primary care.  -- Return to the nearest emergency department for chest pain, shortness of breath, fever >100.5 degrees, dizziness, passing out, profuse vomiting or diarrhea,  sudden one sided numbness and/or weakness, severe headache, new confusion, trouble speaking, difficulty walking, facial droop, or any other concerning symptoms    Complete instructions and follow up are in the patient's After Visit Summary    Minutes spent coordinating discharge and reviewing discharge plan: 40 minutes     Follow-up Information       Your primary care provider. Schedule an appointment as soon as possible for a visit.    Why: for repeat lab work upon your return home                              Signed by: Elgie Congo, PA  Discussed in detail with my attending provider: No att. providers found    [facility]  ADULT OBSERVATION UNIT [phone] F: [phone]  [facility]  Department of Medicine  P: [phone]  F: [phone]    CC: Pcp, None, MD  Please see attending note that follows this mid-level encounter note.     See my progress note for attestation  Jacques Navy, MD

## 2022-08-12 NOTE — Discharge Instructions (Signed)
Reason for your Hospital Admission:  Elevated Creatinine Kinase levels - Rhabdomyolysis     You are were admitted for rhabdomyolysis, which are elevated CK levels secondary to muscle breakdown. We suspect this occurred due to your high intensity workout. You were given IV fluids and your CK levels improved. Complications of rhabdomyolysis include kidney injury/failure. You should continue to hydrate well and follow-up with your primary care provider, avoid high intensity workouts until you are cleared by your primary care provider.     Instructions for after your discharge:  -- Schedule a follow up visit with your primary care provider - within 1 week of discharge from the hospital.  -- Please have labs rechecked in 1 week with your primary care team.   -- Hydrate well.  -- Avoid high intensity physical work outs uncleared cleared to do so by your primary care.  -- Return to the nearest emergency department for chest pain, shortness of breath, fever >100.5 degrees, dizziness, passing out, profuse vomiting or diarrhea,  sudden one sided numbness and/or weakness, severe headache, new confusion, trouble speaking, difficulty walking, facial droop, or any other concerning symptoms

## 2022-08-12 NOTE — Progress Notes (Signed)
[facility] PROGRESS NOTE  [id number]      Date Time: 08/12/22 11:04 AM  Patient Name: Sara Dickson  Attending Physician: Jerral Ralph, MD    CC: follow-up rhabdo    Assessment:   Elevated CK / rhabdo: appears to be exercise induced - improving  Elevated liver enzymes: due to rhabdo - improving  Hematuria: resolved    Recommendations:   Increase po fluids  Stable for d/c  Avoid NSAIDs for now  F/u with PCP in [place]      Case discussed with: pt, hospitalist      Debara Pickett, MD  [facility]  [id number] (office)      Subjective:   Feels well  No hematuria    Review of Systems:   No SOB      Physical Exam:     Vitals:    08/11/22 1913 08/11/22 2318 08/12/22 0411 08/12/22 0816   BP: 99/58 97/54 105/59 99/52   Pulse: 81 71 77 71   Resp: 16 16  16    Temp: 99.5 F (37.5 C) 98.3 F (36.8 C) 98.1 F (36.7 C) 98.6 F (37 C)   TempSrc: Oral Oral Oral Oral   SpO2: 98% 97% 99% 99%   Weight:       Height:           Intake and Output Summary (Last 24 hours) at Date Time  No intake or output data in the 24 hours ending 08/12/22 1104    General: awake, alert, oriented x 3, no acute distress  Cardiovascular: regular rate and rhythm, no murmurs, rubs or gallops  Lungs: clear to auscultation bilaterally, without wheezing, rhonchi, or rales  Abdomen: soft, non-tender, non-distended, normoactive bowel soundsExtremities: no clubbing, cyanosis, or edema      Meds:      Scheduled Meds: PRN Meds:    enoxaparin, 40 mg, Subcutaneous, Daily  sodium chloride, 1,000 mL, Intravenous, Once          Continuous Infusions:   Plasma-Lyte A 150 mL/hr at 08/12/22 0259    acetaminophen, 650 mg, TID PRN  benzocaine-menthol, 1 lozenge, Q2H PRN  benzonatate, 100 mg, TID PRN  carboxymethylcellulose sodium, 1 drop, TID PRN  dextrose, 15 g of glucose, PRN   Or  dextrose, 12.5 g, PRN   Or  dextrose, 12.5 g, PRN   Or  glucagon (rDNA), 1 mg, PRN  magnesium sulfate, 1 g, PRN  melatonin, 3 mg, QHS PRN  naloxone,  0.2 mg, PRN  potassium & sodium phosphates, 2 packet, PRN  potassium chloride, 0-40 mEq, PRN   And  potassium chloride, 10 mEq, PRN  saline, 2 spray, Q4H PRN              Labs:     Recent Labs   Lab 08/11/22  1415   WBC 5.59   Hgb 12.4   Hematocrit 38.6   Platelets 226     Recent Labs   Lab 08/12/22  0413 08/11/22  1415   Sodium 137 137   Potassium 4.1 3.8   Chloride 109 106   CO2 25 23   BUN 11.0 10.0   Creatinine 0.8 0.8   Calcium 8.4* 9.2   Albumin 3.3* 4.0   Glucose 76 80   eGFR >60.0 >60.0       Recent Labs   Lab 08/11/22  1505   Urine Type Urine, Clean Ca   Color, UA Yellow   Clarity, UA Clear  Specific Gravity UA 1.019   Urine pH 6.0   Nitrite, UA Negative   Ketones UA Negative   Urobilinogen, UA Normal   Bilirubin, UA Negative   Blood, UA Large*   RBC, UA 3-5   WBC, UA 11-25*           Imaging personally reviewed, including: No results found.        Signed by: Debara Pickett, MD

## 2022-08-12 NOTE — Discharge Summary -  Nursing (Signed)
Pt discharge home today, d/c education reviewed with pt and pt verbalized understanding, IV removed and pt belongings given back to pt.     Pt is AXOX4, VSS, denies of any pain, N/V.

## 2022-08-18 ENCOUNTER — Other Ambulatory Visit (HOSPITAL_COMMUNITY): Payer: Self-pay

## 2022-08-18 ENCOUNTER — Telehealth: Payer: Self-pay

## 2022-08-18 NOTE — Telephone Encounter (Signed)
Patient Advocate Encounter   Received notification that prior authorization for Tretinoin 0.025% cream is required.   PA submitted on 08/18/22 Key  [id number] Status is pending

## 2022-08-18 NOTE — Telephone Encounter (Signed)
Tretinoin Cream approved with [facility] [place].     Approval letter in media

## 2022-08-20 ENCOUNTER — Ambulatory Visit: Payer: BC Managed Care – PPO | Admitting: Family Medicine

## 2022-08-20 ENCOUNTER — Encounter: Payer: Self-pay | Admitting: Family Medicine

## 2022-08-20 ENCOUNTER — Other Ambulatory Visit: Payer: Self-pay | Admitting: Family Medicine

## 2022-08-20 VITALS — BP 90/60 | HR 77 | Temp 98.1°F | Ht 65.25 in | Wt 127.2 lb

## 2022-08-20 DIAGNOSIS — T796XXD Traumatic ischemia of muscle, subsequent encounter: Secondary | ICD-10-CM

## 2022-08-20 DIAGNOSIS — T796XXA Traumatic ischemia of muscle, initial encounter: Secondary | ICD-10-CM | POA: Insufficient documentation

## 2022-08-20 LAB — COMPREHENSIVE METABOLIC PANEL
ALT: 27 U/L (ref 0–35)
AST: 18 U/L (ref 0–37)
Albumin: 4.2 g/dL (ref 3.5–5.2)
Alkaline Phosphatase: 47 U/L (ref 39–117)
BUN: 11 mg/dL (ref 6–23)
CO2: 28 mEq/L (ref 19–32)
Calcium: 9 mg/dL (ref 8.4–10.5)
Chloride: 104 mEq/L (ref 96–112)
Creatinine, Ser: 0.89 mg/dL (ref 0.40–1.20)
GFR: 79.88 mL/min (ref >60.00)
Glucose, Bld: 88 mg/dL (ref 70–99)
Potassium: 4.3 mEq/L (ref 3.5–5.1)
Sodium: 137 mEq/L (ref 135–145)
Total Bilirubin: 0.3 mg/dL (ref 0.2–1.2)
Total Protein: 7.2 g/dL (ref 6.0–8.3)

## 2022-08-20 LAB — CK: Total CK: 224 U/L — ABNORMAL HIGH (ref 7–177)

## 2022-08-20 NOTE — Progress Notes (Signed)
Patient ID: Emily [name], female    DOB: Aug 14, 1980, 42 y.o.   MRN: [id number]  This visit was conducted in person.  BP 90/60 (BP Location: Right Arm, Patient Position: Sitting, Cuff Size: Normal)   Pulse 77   Temp 98.1 F (36.7 C) (Temporal)   Ht 5' 5.25" (1.657 m)   Wt 127 lb 4 oz (57.7 kg)   SpO2 98%   BMI 21.01 kg/m    CC:  Chief Complaint  Patient presents with   Hospitalization Follow-up    ED Visit 08/11/22    Subjective:   HPI: Emily [name] is a 42 y.o. female presenting on 08/20/2022 for Hospitalization Follow-up (ED Visit 08/11/22)  Seen for traumatic rhabdomyolysis following a excessive workout.  Hospital Course: Upon ED arrival, VSS. Labs notable for: AST/ALT 296/103, CK 25,364. UA with large LE, large blood, WBC 11-25. Urine culture pending. Received weight-dosed NS fluid bolus in ED. patient was admitted to observation for rhabdomyolysis secondary to muscle injury. Started on IV Plasma-Lyte at 150 mL/hr. she was seen by nephrology who cleared for discharge if patient's CK level improved to = or < 25K. Patient's AST/ALT improved from 296/103 -> 250/91 and CK 25,364 -> 19,629. Rec'd hydrate well on discharge. Hold off on further high intensity workouts until cleared by PCP. Should f/u with PCP in 3-5 days for repeat labs.   Today she reports no further muscle soreness in last week. Resolved after 5-7 days.     Working on  hydration in last week... 2-3,  16 oz a day.     Relevant past medical, surgical, family and social history reviewed and updated as indicated. Interim medical history since our last visit reviewed. Allergies and medications reviewed and updated. Outpatient Medications Prior to Visit  Medication Sig Dispense Refill   acetaminophen (TYLENOL) 325 MG tablet Take 650 mg by mouth every 6 (six) hours as needed.     Cholecalciferol 1.25 MG (50000 UT) TABS Take 1 tablet by mouth once a week. 4 tablet 1   Clindamycin-Benzoyl Per, Refr, gel APPLY  TOPICALLY TO AFFECTED AREA EVERY DAY 45 g 0   ibuprofen (ADVIL) 200 MG tablet Take 200 mg by mouth every 6 (six) hours as needed.     ketoconazole (NIZORAL) 2 % shampoo APPLY TO AFFECTED AREA TWICE A WEEK 120 mL 0   levonorgestrel (MIRENA, 52 MG,) 20 MCG/24HR IUD Mirena 20 mcg/24 hours (6 yrs) 52 mg intrauterine device  Take 1 device by intrauterine route.     polyethylene glycol powder (GLYCOLAX/MIRALAX) 17 GM/SCOOP powder Take 1 Container by mouth as needed.     tretinoin (RETIN-A) 0.025 % cream Apply topically at bedtime. 45 g 0   No facility-administered medications prior to visit.     Per HPI unless specifically indicated in ROS section below Review of Systems  Constitutional:  Negative for fatigue and fever.  HENT:  Negative for congestion.   Eyes:  Negative for pain.  Respiratory:  Negative for cough and shortness of breath.   Cardiovascular:  Negative for chest pain, palpitations and leg swelling.  Gastrointestinal:  Negative for abdominal pain.  Genitourinary:  Negative for dysuria and vaginal bleeding.  Musculoskeletal:  Negative for back pain.  Neurological:  Negative for syncope, light-headedness and headaches.  Psychiatric/Behavioral:  Negative for dysphoric mood.    Objective:  BP 90/60 (BP Location: Right Arm, Patient Position: Sitting, Cuff Size: Normal)   Pulse 77   Temp 98.1 F (36.7 C) (Temporal)  Ht 5' 5.25" (1.657 m)   Wt 127 lb 4 oz (57.7 kg)   SpO2 98%   BMI 21.01 kg/m   Wt Readings from Last 3 Encounters:  08/20/22 127 lb 4 oz (57.7 kg)  08/10/22 129 lb (58.5 kg)  05/07/21 129 lb 1 oz (58.5 kg)      Physical Exam Constitutional:      General: She is not in acute distress.    Appearance: Normal appearance. She is well-developed. She is not ill-appearing or toxic-appearing.  HENT:     Head: Normocephalic.     Right Ear: Hearing, tympanic membrane, ear canal and external ear normal. Tympanic membrane is not erythematous, retracted or bulging.      Left Ear: Hearing, tympanic membrane, ear canal and external ear normal. Tympanic membrane is not erythematous, retracted or bulging.     Nose: No mucosal edema or rhinorrhea.     Right Sinus: No maxillary sinus tenderness or frontal sinus tenderness.     Left Sinus: No maxillary sinus tenderness or frontal sinus tenderness.     Mouth/Throat:     Pharynx: Uvula midline.  Eyes:     General: Lids are normal. Lids are everted, no foreign bodies appreciated.     Conjunctiva/sclera: Conjunctivae normal.     Pupils: Pupils are equal, round, and reactive to light.  Neck:     Thyroid: No thyroid mass or thyromegaly.     Vascular: No carotid bruit.     Trachea: Trachea normal.  Cardiovascular:     Rate and Rhythm: Normal rate and regular rhythm.     Pulses: Normal pulses.     Heart sounds: Normal heart sounds, S1 normal and S2 normal. No murmur heard.    No friction rub. No gallop.  Pulmonary:     Effort: Pulmonary effort is normal. No tachypnea or respiratory distress.     Breath sounds: Normal breath sounds. No decreased breath sounds, wheezing, rhonchi or rales.  Abdominal:     General: Bowel sounds are normal.     Palpations: Abdomen is soft.     Tenderness: There is no abdominal tenderness.  Musculoskeletal:     Cervical back: Normal range of motion and neck supple.  Skin:    General: Skin is warm and dry.     Findings: No rash.  Neurological:     Mental Status: She is alert.  Psychiatric:        Mood and Affect: Mood is not anxious or depressed.        Speech: Speech normal.        Behavior: Behavior normal. Behavior is cooperative.        Thought Content: Thought content normal.        Judgment: Judgment normal.       Results for orders placed or performed in visit on 08/10/22  CK  Result Value Ref Range   Total CK 18,086 (H) 7 - 177 U/L    Assessment and Plan  Traumatic rhabdomyolysis, subsequent encounter Assessment & Plan: Acute, improved at discharge from  hospital. No further muscle pain.  Moderately aggressive hydration recently.  Will reevaluate with complete metabolic panel and CK today.  Orders: -     Comprehensive metabolic panel -     CK    No follow-ups on file.   Kerby Nora, MD

## 2022-08-20 NOTE — Assessment & Plan Note (Signed)
Acute, improved at discharge from hospital. No further muscle pain.  Moderately aggressive hydration recently.  Will reevaluate with complete metabolic panel and CK today.

## 2022-09-03 ENCOUNTER — Other Ambulatory Visit (INDEPENDENT_AMBULATORY_CARE_PROVIDER_SITE_OTHER): Payer: BC Managed Care – PPO

## 2022-09-03 DIAGNOSIS — T796XXD Traumatic ischemia of muscle, subsequent encounter: Secondary | ICD-10-CM

## 2022-09-03 LAB — CK: Total CK: 67 U/L (ref 7–177)

## 2022-10-07 ENCOUNTER — Other Ambulatory Visit: Payer: Self-pay | Admitting: Family

## 2022-10-08 NOTE — Telephone Encounter (Signed)
Patient called in to check on the status of this rx being sent in for her.Please advise

## 2022-10-08 NOTE — Telephone Encounter (Signed)
Last office visit 08/20/22 for hospital follow up.  Last refilled 05/21/2022 for 45 g with no refills.  Next Appt: No future appointments.  Worthy Rancher did last refill so the refill request this time was probably sent to her so that is why there has been a delay.

## 2022-12-12 ENCOUNTER — Other Ambulatory Visit: Payer: Self-pay | Admitting: Family Medicine

## 2022-12-13 NOTE — Telephone Encounter (Signed)
Should patient need refill yet or should they still have one month?

## 2023-02-22 ENCOUNTER — Other Ambulatory Visit: Payer: Self-pay | Admitting: Family Medicine

## 2023-02-22 NOTE — Telephone Encounter (Signed)
Last office visit 08/20/2022 for hospital follow up.  Last refilled 12/14/222 for 45 g with no refills.  Next Appt: No future appointments.

## 2023-06-07 ENCOUNTER — Other Ambulatory Visit: Payer: Self-pay | Admitting: Family Medicine

## 2023-06-07 NOTE — Telephone Encounter (Signed)
 Last office visit 08/20/2022 for hospital follow up.  Last refilled Tretinoin 08/10/2022 for 45 g with no refills.  Clindamycin-Benzoyl 02/19/2023 for 45 g with no refills.  Next Appt: No future appointments.

## 2023-08-12 ENCOUNTER — Ambulatory Visit (INDEPENDENT_AMBULATORY_CARE_PROVIDER_SITE_OTHER): Admitting: Family Medicine

## 2023-08-12 ENCOUNTER — Encounter: Payer: Self-pay | Admitting: Family Medicine

## 2023-08-12 VITALS — BP 100/60 | HR 82 | Temp 97.3°F | Ht 65.25 in | Wt 132.0 lb

## 2023-08-12 DIAGNOSIS — Z832 Family history of diseases of the blood and blood-forming organs and certain disorders involving the immune mechanism: Secondary | ICD-10-CM | POA: Diagnosis not present

## 2023-08-12 DIAGNOSIS — Z13220 Encounter for screening for lipoid disorders: Secondary | ICD-10-CM | POA: Diagnosis not present

## 2023-08-12 DIAGNOSIS — G44229 Chronic tension-type headache, not intractable: Secondary | ICD-10-CM

## 2023-08-12 DIAGNOSIS — E559 Vitamin D deficiency, unspecified: Secondary | ICD-10-CM | POA: Diagnosis not present

## 2023-08-12 DIAGNOSIS — K5909 Other constipation: Secondary | ICD-10-CM | POA: Diagnosis not present

## 2023-08-12 DIAGNOSIS — Z0000 Encounter for general adult medical examination without abnormal findings: Secondary | ICD-10-CM

## 2023-08-12 LAB — LIPID PANEL
Cholesterol: 163 mg/dL (ref 0–200)
HDL: 50.3 mg/dL (ref >39.00)
LDL Cholesterol: 102 mg/dL — ABNORMAL HIGH (ref 0–99)
NonHDL: 112.69
Total CHOL/HDL Ratio: 3
Triglycerides: 54 mg/dL (ref 0.0–149.0)
VLDL: 10.8 mg/dL (ref 0.0–40.0)

## 2023-08-12 LAB — CBC WITH DIFFERENTIAL/PLATELET
Basophils Absolute: 0.1 10*3/uL (ref 0.0–0.1)
Basophils Relative: 1.5 % (ref 0.0–3.0)
Eosinophils Absolute: 0.1 10*3/uL (ref 0.0–0.7)
Eosinophils Relative: 1.6 % (ref 0.0–5.0)
HCT: 40.4 % (ref 36.0–46.0)
Hemoglobin: 13.3 g/dL (ref 12.0–15.0)
Lymphocytes Relative: 26.3 % (ref 12.0–46.0)
Lymphs Abs: 1.1 10*3/uL (ref 0.7–4.0)
MCHC: 32.9 g/dL (ref 30.0–36.0)
MCV: 89 fl (ref 78.0–100.0)
Monocytes Absolute: 0.3 10*3/uL (ref 0.1–1.0)
Monocytes Relative: 7.9 % (ref 3.0–12.0)
Neutro Abs: 2.6 10*3/uL (ref 1.4–7.7)
Neutrophils Relative %: 62.7 % (ref 43.0–77.0)
Platelets: 252 10*3/uL (ref 150.0–400.0)
RBC: 4.54 Mil/uL (ref 3.87–5.11)
RDW: 13.2 % (ref 11.5–15.5)
WBC: 4.1 10*3/uL (ref 4.0–10.5)

## 2023-08-12 LAB — COMPREHENSIVE METABOLIC PANEL WITH GFR
ALT: 9 U/L (ref 0–35)
AST: 11 U/L (ref 0–37)
Albumin: 4.2 g/dL (ref 3.5–5.2)
Alkaline Phosphatase: 42 U/L (ref 39–117)
BUN: 12 mg/dL (ref 6–23)
CO2: 28 meq/L (ref 19–32)
Calcium: 9 mg/dL (ref 8.4–10.5)
Chloride: 106 meq/L (ref 96–112)
Creatinine, Ser: 0.97 mg/dL (ref 0.40–1.20)
GFR: 71.55 mL/min (ref >60.00)
Glucose, Bld: 86 mg/dL (ref 70–99)
Potassium: 4.1 meq/L (ref 3.5–5.1)
Sodium: 139 meq/L (ref 135–145)
Total Bilirubin: 0.3 mg/dL (ref 0.2–1.2)
Total Protein: 7 g/dL (ref 6.0–8.3)

## 2023-08-12 NOTE — Assessment & Plan Note (Signed)
 Son's with Duffy Null phenotype which causes low neutrophils.

## 2023-08-12 NOTE — Assessment & Plan Note (Signed)
 Due for re-eval.

## 2023-08-12 NOTE — Assessment & Plan Note (Signed)
Chronic stable control.  Associated with menses.

## 2023-08-12 NOTE — Assessment & Plan Note (Signed)
Stable, chronic.  Continue current medication.   Using miralax 2 times daily.  Moderate water and fiber use.

## 2023-08-12 NOTE — Progress Notes (Signed)
 Patient ID: Emily [name], female    DOB: 08-28-1980, 43 y.o.   MRN: [id number]  This visit was conducted in person.  BP 100/60   Pulse 82   Temp (!) 97.3 F (36.3 C) (Temporal)   Ht 5' 5.25" (1.657 m)   Wt 132 lb (59.9 kg)   SpO2 98%   BMI 21.80 kg/m    CC:  Chief Complaint  Patient presents with   Annual Exam    Subjective:   HPI: Emily [name] is a 43 y.o. female presenting on 08/12/2023 for Annual Exam  The patient presents for complete physical and review of chronic health problems. He/She also has the following acute concerns today: none  GYN: 06/18/2022 Dr. Arlyne Lame  The patient presents for  complete physical and review of chronic health problems. He/She also has the following acute concerns today: none      Chronic tension headaches: associated with menses. Using tylenol or ibuprofen.  Not associated with light and sound sensitivity.   Chronic constipation: Using miralax 2 times daily  Has mirena .   Has not been taking vit D regularly.  Relevant past medical, surgical, family and social history reviewed and updated as indicated. Interim medical history since our last visit reviewed. Allergies and medications reviewed and updated. Outpatient Medications Prior to Visit  Medication Sig Dispense Refill   acetaminophen (TYLENOL) 325 MG tablet Take 650 mg by mouth every 6 (six) hours as needed.     Clindamycin -Benzoyl Per, Refr, gel APPLY TOPICALLY TO AFFECTED AREA EVERY DAY 45 g 0   ibuprofen (ADVIL) 200 MG tablet Take 200 mg by mouth every 6 (six) hours as needed.     ketoconazole  (NIZORAL ) 2 % shampoo APPLY TO AFFECTED AREA TWICE A WEEK 120 mL 0   levonorgestrel  (MIRENA , 52 MG,) 20 MCG/24HR IUD Mirena  20 mcg/24 hours (6 yrs) 52 mg intrauterine device  Take 1 device by intrauterine route.     polyethylene glycol powder (GLYCOLAX/MIRALAX) 17 GM/SCOOP powder Take 1 Container by mouth as needed.     SODIUM FLUORIDE 5000 SENSITIVE 1.1-5 % GEL at bedtime.      tretinoin  (RETIN-A ) 0.025 % cream APPLY TO AFFECTED AREA EVERY DAY AT BEDTIME 45 g 0   Cholecalciferol  1.25 MG (50000 UT) TABS Take 1 tablet by mouth once a week. 4 tablet 1   No facility-administered medications prior to visit.     Per HPI unless specifically indicated in ROS section below Review of Systems  Constitutional:  Negative for fatigue and fever.  HENT:  Negative for congestion.   Eyes:  Negative for pain.  Respiratory:  Negative for cough and shortness of breath.   Cardiovascular:  Negative for chest pain, palpitations and leg swelling.  Gastrointestinal:  Negative for abdominal pain.  Genitourinary:  Negative for dysuria and vaginal bleeding.  Musculoskeletal:  Negative for back pain.  Neurological:  Negative for syncope, light-headedness and headaches.  Psychiatric/Behavioral:  Negative for dysphoric mood.    Objective:  BP 100/60   Pulse 82   Temp (!) 97.3 F (36.3 C) (Temporal)   Ht 5' 5.25" (1.657 m)   Wt 132 lb (59.9 kg)   SpO2 98%   BMI 21.80 kg/m   Wt Readings from Last 3 Encounters:  08/12/23 132 lb (59.9 kg)  08/20/22 127 lb 4 oz (57.7 kg)  08/10/22 129 lb (58.5 kg)      Physical Exam Constitutional:      General: She is not in acute distress.  Appearance: Normal appearance. She is well-developed. She is not ill-appearing or toxic-appearing.  HENT:     Head: Normocephalic.     Right Ear: Hearing, tympanic membrane, ear canal and external ear normal. Tympanic membrane is not erythematous, retracted or bulging.     Left Ear: Hearing, tympanic membrane, ear canal and external ear normal. Tympanic membrane is not erythematous, retracted or bulging.     Nose: No mucosal edema or rhinorrhea.     Right Sinus: No maxillary sinus tenderness or frontal sinus tenderness.     Left Sinus: No maxillary sinus tenderness or frontal sinus tenderness.     Mouth/Throat:     Pharynx: Uvula midline.  Eyes:     General: Lids are normal. Lids are everted, no foreign  bodies appreciated.     Conjunctiva/sclera: Conjunctivae normal.     Pupils: Pupils are equal, round, and reactive to light.  Neck:     Thyroid : No thyroid  mass or thyromegaly.     Vascular: No carotid bruit.     Trachea: Trachea normal.  Cardiovascular:     Rate and Rhythm: Normal rate and regular rhythm.     Pulses: Normal pulses.     Heart sounds: Normal heart sounds, S1 normal and S2 normal. No murmur heard.    No friction rub. No gallop.  Pulmonary:     Effort: Pulmonary effort is normal. No tachypnea or respiratory distress.     Breath sounds: Normal breath sounds. No decreased breath sounds, wheezing, rhonchi or rales.  Abdominal:     General: Bowel sounds are normal.     Palpations: Abdomen is soft.     Tenderness: There is no abdominal tenderness.  Musculoskeletal:     Cervical back: Normal range of motion and neck supple.  Skin:    General: Skin is warm and dry.     Findings: No rash.  Neurological:     Mental Status: She is alert.  Psychiatric:        Mood and Affect: Mood is not anxious or depressed.        Speech: Speech normal.        Behavior: Behavior normal. Behavior is cooperative.        Thought Content: Thought content normal.        Judgment: Judgment normal.       Results for orders placed or performed in visit on 09/03/22  CK   Collection Time: 09/03/22  9:19 AM  Result Value Ref Range   Total CK 67 7 - 177 U/L    Assessment and Plan The patient's preventative maintenance and recommended screening tests for an annual wellness exam were reviewed in full today. Brought up to date unless services declined.  Counselled on the importance of diet, exercise, and its role in overall health and mortality. The patient's FH and SH was reviewed, including their home life, tobacco status, and drug and alcohol status.    Vaccines: TDap 2020, COVID x 3, Pap/DVE:  2023 neg HPV, repeat in 5 years... has appt pending. Mammo:  06/2022 at [facility] Bone Density:  not indcated Colon:  not indicated. Smoking Status: none ETOH/ drug use: rare/none  Hep C:  done  HIV screen:   done  Routine general medical examination at a health care facility  Screening cholesterol level -     Lipid panel -     Comprehensive metabolic panel with GFR  Vitamin D  deficiency Assessment & Plan:  Due for re-eval.  Orders: -  VITAMIN D  25 Hydroxy (Vit-D Deficiency, Fractures)  Chronic constipation Assessment & Plan: Stable, chronic.  Continue current medication.   Using miralax 2 times daily.  Moderate water and fiber use.   Chronic tension-type headache, not intractable Assessment & Plan:  Chronic stable control.  Associated with menses.   Family history of neutropenia Assessment & Plan:  Son's with Duffy Null phenotype which causes low neutrophils.  Orders: -     CBC with Differential/Platelet    Return in about 1 year (around 08/11/2024) for annual physical with fasting labs prior.   Herby Lolling, MD

## 2023-08-15 DIAGNOSIS — Z1231 Encounter for screening mammogram for malignant neoplasm of breast: Secondary | ICD-10-CM | POA: Diagnosis not present

## 2023-08-15 DIAGNOSIS — Z01419 Encounter for gynecological examination (general) (routine) without abnormal findings: Secondary | ICD-10-CM | POA: Diagnosis not present

## 2023-08-15 DIAGNOSIS — Z130 Encounter for screening for diseases of the blood and blood-forming organs and certain disorders involving the immune mechanism: Secondary | ICD-10-CM | POA: Diagnosis not present

## 2023-08-18 LAB — VITAMIN D 25 HYDROXY (VIT D DEFICIENCY, FRACTURES): VITD: 22.4 ng/mL — ABNORMAL LOW (ref 30.00–100.00)

## 2023-08-19 ENCOUNTER — Ambulatory Visit: Payer: Self-pay | Admitting: Family Medicine

## 2023-08-19 MED ORDER — VITAMIN D3 1.25 MG (50000 UT) PO CAPS: 1.0000 | ORAL_CAPSULE | ORAL | 0 refills | Status: DC

## 2023-09-16 DIAGNOSIS — T8332XA Displacement of intrauterine contraceptive device, initial encounter: Secondary | ICD-10-CM | POA: Diagnosis not present

## 2023-09-22 ENCOUNTER — Other Ambulatory Visit: Payer: Self-pay | Admitting: Family Medicine

## 2023-09-22 NOTE — Telephone Encounter (Signed)
 Last office visit 08/12/2023 for CPE.  Last refilled 06/07/23 for 45 g with no refills.  Next Appt: No future appointments.

## 2023-09-23 ENCOUNTER — Encounter: Payer: Self-pay | Admitting: Advanced Practice Midwife

## 2023-11-10 ENCOUNTER — Other Ambulatory Visit: Payer: Self-pay | Admitting: Family Medicine

## 2023-11-10 NOTE — Telephone Encounter (Signed)
 Last office visit 08/12/2023 for CPE.  Last refilled Vit D 08/19/23 for #12 with no refills.  Vit D level 08/12/23 low at 22.40 ng/ml.  Cllindamycin-Benzoyl 09/22/23 for 45 g with no refills.  Retin A 06/07/23 for 45 g with no refills.  Next Appt: No future appointments.

## 2024-02-03 ENCOUNTER — Other Ambulatory Visit: Payer: Self-pay | Admitting: Family Medicine

## 2024-02-06 NOTE — Telephone Encounter (Signed)
 Last office visit 08/12/2023 for CPE.  Last refilled 11/10/23 for 45 g with no refills.  Next appt: No future appointments.

## 2024-03-14 ENCOUNTER — Other Ambulatory Visit: Payer: Self-pay | Admitting: Family Medicine

## 2024-03-14 NOTE — Telephone Encounter (Signed)
 Last office visit 08/12/2023 for CPE.  Last refilled 02/07/2024 for #45 with no refills. Next appt: No future appointments.
